# Patient Record
Sex: Male | Born: 1985 | Race: Black or African American | Hispanic: No | Marital: Single | Smoking: Never smoker
Health system: Southern US, Community
[De-identification: ages and names within clinical notes are randomized; demographics above are authoritative.]

## PROBLEM LIST (undated history)

## (undated) DIAGNOSIS — B20 Human immunodeficiency virus [HIV] disease: Secondary | ICD-10-CM

## (undated) DIAGNOSIS — K4090 Unilateral inguinal hernia, without obstruction or gangrene, not specified as recurrent: Secondary | ICD-10-CM

## (undated) DIAGNOSIS — S99919A Unspecified injury of unspecified ankle, initial encounter: Secondary | ICD-10-CM

## (undated) HISTORY — PX: INGUINAL HERNIA REPAIR: SUR1180

## (undated) HISTORY — DX: Human immunodeficiency virus (HIV) disease: B20

---

## 2011-11-23 ENCOUNTER — Encounter (HOSPITAL_COMMUNITY): Payer: Self-pay | Admitting: *Deleted

## 2011-11-23 ENCOUNTER — Emergency Department (HOSPITAL_COMMUNITY)
Admission: EM | Admit: 2011-11-23 | Discharge: 2011-11-23 | Disposition: A | Discharge status: Home / Self Care | Payer: No Typology Code available for payment source | Attending: Emergency Medicine | Admitting: Emergency Medicine

## 2011-11-23 DIAGNOSIS — M62830 Muscle spasm of back: Secondary | ICD-10-CM

## 2011-11-23 DIAGNOSIS — Z043 Encounter for examination and observation following other accident: Secondary | ICD-10-CM | POA: Insufficient documentation

## 2011-11-23 DIAGNOSIS — M5380 Other specified dorsopathies, site unspecified: Secondary | ICD-10-CM | POA: Insufficient documentation

## 2011-11-23 DIAGNOSIS — Z041 Encounter for examination and observation following transport accident: Secondary | ICD-10-CM

## 2011-11-23 HISTORY — DX: Unilateral inguinal hernia, without obstruction or gangrene, not specified as recurrent: K40.90

## 2011-11-23 MED ORDER — MELOXICAM 15 MG PO TABS
15.0000 mg | ORAL_TABLET | Freq: Every day | ORAL | Status: DC
Start: 2011-11-23 — End: 2012-01-11

## 2011-11-23 NOTE — ED Notes (Signed)
Patient given discharge instructions, information, prescriptions, and diet order. Patient states that they adequately understand discharge information given and to return to ED if symptoms return or worsen.     
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
## 2011-11-24 NOTE — ED Provider Notes (Signed)
Medical screening examination/treatment/procedure(s) were performed by non-physician practitioner and as supervising physician I was immediately available for consultation/collaboration.    Margarito Dehaas L Melanie Openshaw, MD 11/24/11 1702 
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
## 2012-01-11 ENCOUNTER — Emergency Department (HOSPITAL_COMMUNITY): Payer: BC Managed Care – PPO

## 2012-01-11 ENCOUNTER — Emergency Department (HOSPITAL_COMMUNITY)
Admission: EM | Admit: 2012-01-11 | Discharge: 2012-01-11 | Disposition: A | Discharge status: Home / Self Care | Payer: BC Managed Care – PPO | Attending: Emergency Medicine | Admitting: Emergency Medicine

## 2012-01-11 DIAGNOSIS — M7989 Other specified soft tissue disorders: Secondary | ICD-10-CM | POA: Insufficient documentation

## 2012-01-11 DIAGNOSIS — M76829 Posterior tibial tendinitis, unspecified leg: Secondary | ICD-10-CM | POA: Insufficient documentation

## 2012-01-11 DIAGNOSIS — Z8719 Personal history of other diseases of the digestive system: Secondary | ICD-10-CM | POA: Insufficient documentation

## 2012-01-11 MED ORDER — NAPROXEN 500 MG PO TABS: 500.0000 mg | ORAL_TABLET | Freq: Two times a day (BID) | ORAL | Status: DC

## 2012-01-11 NOTE — ED Provider Notes (Signed)
History     CSN: [id number]  Arrival date & time 01/11/12  0865   First MD Initiated Contact with Patient 01/11/12 1924      Chief Complaint  Patient presents with  . Ankle Pain    (Consider location/radiation/quality/duration/timing/severity/associated sxs/prior treatment) HPI Comments: Ronald Salas is a 26 y.o. male who presents with complaint of right foot/anile pain for about 3 days. States just started a new job and is standing a lot. Denies any injuries. States he has very flat feet and not wearing his orthotics. Denies numbness or weakness to the toes or distal foot. No redness, no fever, chills, malaise. No hx of the same.      Past Medical History  Diagnosis Date  . Inguinal hernia, unilateral     Past Surgical History  Procedure Date  . Inguinal hernia repair     No family history on file.  History  Substance Use Topics  . Smoking status: Not on file  . Smokeless tobacco: Not on file  . Alcohol Use:       Review of Systems  Constitutional: Negative for fever and chills.  Musculoskeletal: Positive for joint swelling and arthralgias.  Skin: Negative.   Neurological: Negative for weakness and numbness.    Allergies  Codeine  Home Medications  No current outpatient prescriptions on file.  BP 119/72  Pulse 73  Temp 98.4 F (36.9 C) (Oral)  Resp 17  SpO2 100%  Physical Exam  Nursing note and vitals reviewed. Constitutional: He is oriented to person, place, and time. He appears well-developed and well-nourished. No distress.  Eyes: Conjunctivae normal are normal.  Cardiovascular: Normal rate and normal heart sounds.   Pulmonary/Chest: Effort normal and breath sounds normal. No respiratory distress. He has no wheezes. He has no rales.  Musculoskeletal:       Swelling noted to the medial right ankle. Tender over posterior tibial tendon. Pain with active inversion of the ankle. No pain with passive inversion, pain with passive eversion. Normal  dorsal pedal pulses  Neurological: He is alert and oriented to person, place, and time.  Skin: Skin is warm and dry.    ED Course  Procedures (including critical care time)  Labs Reviewed - No data to display Dg Ankle Complete Right  01/11/2012  *RADIOLOGY REPORT*  Clinical Data: 26 year old male with right ankle pain and swelling. No known injury.  RIGHT ANKLE - COMPLETE 3+ VIEW  Comparison: None  Findings: Medial soft tissue swelling is noted.  No evidence of acute fracture, subluxation or dislocation identified.  No radio-opaque foreign bodies are present.  No focal bony lesions are noted.  The joint spaces are unremarkable.  IMPRESSION: Soft tissue swelling without bony or joint abnormality.   Original Report Authenticated By: Harmon Pier, M.D.      1. Posterior tibial tendonitis       MDM  Pt with non traumatic right ankle pain. Exam consistent with posterior tibial tendonitis. X-ray negative. Will do ACE wrap, ice, elevation, NSAIDs, follow up with orthopedics. Instructed to wear supportive shoes, and his orthotics.        Lottie Mussel, PA 01/12/12 [phone]

## 2012-01-11 NOTE — ED Notes (Signed)
Pt c/o pain and swelling to R ankle. Pt denies injury, but states he is on his feet at work a lot. Pt states R ankle is more swollen than L ankle. Pt ambulatory to exam room in triage with steady gait.

## 2012-01-12 NOTE — ED Provider Notes (Signed)
Medical screening examination/treatment/procedure(s) were performed by non-physician practitioner and as supervising physician I was immediately available for consultation/collaboration.  Doug Sou, MD 01/12/12 [phone]

## 2012-02-04 ENCOUNTER — Encounter (HOSPITAL_COMMUNITY): Payer: Self-pay | Admitting: Emergency Medicine

## 2012-02-04 ENCOUNTER — Emergency Department (HOSPITAL_COMMUNITY): Payer: BC Managed Care – PPO

## 2012-02-04 ENCOUNTER — Emergency Department (HOSPITAL_COMMUNITY)
Admission: EM | Admit: 2012-02-04 | Discharge: 2012-02-04 | Disposition: A | Discharge status: Home / Self Care | Payer: BC Managed Care – PPO | Attending: Emergency Medicine | Admitting: Emergency Medicine

## 2012-02-04 DIAGNOSIS — Z8719 Personal history of other diseases of the digestive system: Secondary | ICD-10-CM | POA: Insufficient documentation

## 2012-02-04 DIAGNOSIS — M25579 Pain in unspecified ankle and joints of unspecified foot: Secondary | ICD-10-CM | POA: Insufficient documentation

## 2012-02-04 MED ORDER — IBUPROFEN 600 MG PO TABS: 600.0000 mg | ORAL_TABLET | Freq: Four times a day (QID) | ORAL | Status: DC | PRN

## 2012-02-04 NOTE — ED Notes (Signed)
[id number]<AV> Expected date:<BR> Expected time:<BR> Means of arrival:<BR> Comments:<BR> Cleaning

## 2012-02-04 NOTE — ED Notes (Signed)
Patient states that he has pain to his ankle. Just woke up this am with the pain

## 2012-02-05 NOTE — ED Provider Notes (Signed)
Medical screening examination/treatment/procedure(s) were performed by non-physician practitioner and as supervising physician I was immediately available for consultation/collaboration.  Jeanae Whitmill, MD 02/05/12 1702 

## 2012-02-05 NOTE — ED Provider Notes (Signed)
History     CSN: [id number]  Arrival date & time 02/04/12  1534   First MD Initiated Contact with Patient 02/04/12 1654      Chief Complaint  Patient presents with  . Ankle Pain    (Consider location/radiation/quality/duration/timing/severity/associated sxs/prior treatment) HPI Comments: This is a 26 year old male, who presents emergency department with chief complaint of ankle pain. Patient seen here previously for the same. States that today he woke up in the pain was worse. The pain is located over the medial aspect of the right ankle. States that the pain is moderate to severe with palpation. He has tried naproxen with some relief. There are no aggravating or alleviating factors.  The history is provided by the patient. No language interpreter was used.    Past Medical History  Diagnosis Date  . Inguinal hernia, unilateral     Past Surgical History  Procedure Date  . Inguinal hernia repair     History reviewed. No pertinent family history.  History  Substance Use Topics  . Smoking status: Never Smoker   . Smokeless tobacco: Not on file  . Alcohol Use: No      Review of Systems  All other systems reviewed and are negative.    Allergies  Codeine  Home Medications   Current Outpatient Rx  Name  Route  Sig  Dispense  Refill  . NAPROXEN 500 MG PO TABS   Oral   Take 500 mg by mouth 2 (two) times daily as needed. For pain.         . IBUPROFEN 600 MG PO TABS   Oral   Take 1 tablet (600 mg total) by mouth every 6 (six) hours as needed for pain.   30 tablet   0     BP 113/68  Pulse 62  Temp 98.9 F (37.2 C) (Oral)  Resp 12  SpO2 100%  Physical Exam  Nursing note and vitals reviewed. Constitutional: He is oriented to person, place, and time. He appears well-developed and well-nourished.  HENT:  Head: Normocephalic and atraumatic.  Eyes: Conjunctivae normal and EOM are normal.  Neck: Normal range of motion.  Cardiovascular: Normal rate.     Pulmonary/Chest: Effort normal.  Abdominal: He exhibits no distension.  Musculoskeletal: Normal range of motion. He exhibits tenderness. He exhibits no edema.       Right ankle tender to palpation over the deltoid ligament, without swelling or heat. Range of motion and strength are intact.  Neurological: He is alert and oriented to person, place, and time.  Skin: Skin is dry.  Psychiatric: He has a normal mood and affect. His behavior is normal. Judgment and thought content normal.    ED Course  Procedures (including critical care time)  Labs Reviewed - No data to display Dg Ankle Complete Right  02/04/2012  *RADIOLOGY REPORT*  Clinical Data: Medial ankle pain  RIGHT ANKLE - COMPLETE 3+ VIEW  Comparison: 01/11/2012  Findings: Resolve soft tissue swelling.  Normal alignment without fracture.  Intact malleoli, talus and calcaneus.  IMPRESSION: No acute osseous finding.   Original Report Authenticated By: Judie Petit. Shick, M.D.      1. Ankle pain       MDM  26 year old male with ankle pain. I will race the patient's ankle, and recommend orthopedic followup. I believe this to be a deltoid tendinitis. Will treat the patient with ibuprofen per week, and encourage him to use ice. Patient understands and agrees with the plan. He is stable and ready  for discharge.       Roxy Horseman, PA-C 02/05/12 [phone]

## 2012-04-15 ENCOUNTER — Emergency Department (HOSPITAL_COMMUNITY)
Admission: EM | Admit: 2012-04-15 | Discharge: 2012-04-15 | Disposition: A | Discharge status: Home / Self Care | Payer: BC Managed Care – PPO | Attending: Emergency Medicine | Admitting: Emergency Medicine

## 2012-04-15 ENCOUNTER — Encounter (HOSPITAL_COMMUNITY): Payer: Self-pay | Admitting: *Deleted

## 2012-04-15 DIAGNOSIS — Z87828 Personal history of other (healed) physical injury and trauma: Secondary | ICD-10-CM | POA: Insufficient documentation

## 2012-04-15 DIAGNOSIS — R350 Frequency of micturition: Secondary | ICD-10-CM | POA: Insufficient documentation

## 2012-04-15 DIAGNOSIS — Z8719 Personal history of other diseases of the digestive system: Secondary | ICD-10-CM | POA: Insufficient documentation

## 2012-04-15 HISTORY — DX: Unspecified injury of unspecified ankle, initial encounter: S99.919A

## 2012-04-15 LAB — URINALYSIS, ROUTINE W REFLEX MICROSCOPIC
Bilirubin Urine: NEGATIVE
Glucose, UA: NEGATIVE mg/dL
Protein, ur: NEGATIVE mg/dL
Urobilinogen, UA: 1 mg/dL (ref 0.0–1.0)

## 2012-04-15 LAB — URINE MICROSCOPIC-ADD ON

## 2012-04-15 MED ORDER — PHENAZOPYRIDINE HCL 100 MG PO TABS: 100.0000 mg | ORAL_TABLET | Freq: Three times a day (TID) | ORAL | Status: DC

## 2012-04-15 MED ORDER — PHENAZOPYRIDINE HCL 100 MG PO TABS
100.0000 mg | ORAL_TABLET | Freq: Three times a day (TID) | ORAL | Status: DC
  Administered 2012-04-15: 100 mg via ORAL
  Filled 2012-04-15 (×5): qty 1

## 2012-04-15 NOTE — ED Provider Notes (Signed)
History     CSN: [id number]  Arrival date & time 04/15/12  0019   First MD Initiated Contact with Patient 04/15/12 0310      Chief Complaint  Patient presents with  . Urinary Frequency    (Consider location/radiation/quality/duration/timing/severity/associated sxs/prior treatment) HPI Comments: Patient report increased urinary frequency for the past 2 days without dysuria, penile discharge. No fever, diarrhea, abdominal pain testicular pain   Patient is a 27 y.o. male presenting with frequency. The history is provided by the patient.  Urinary Frequency This is a new problem. The current episode started yesterday. The problem occurs intermittently. The problem has been unchanged. Associated symptoms include urinary symptoms. Pertinent negatives include no chills or fever. He has tried nothing for the symptoms.    Past Medical History  Diagnosis Date  . Inguinal hernia, unilateral   . Ankle injury     Past Surgical History  Procedure Laterality Date  . Inguinal hernia repair      History reviewed. No pertinent family history.  History  Substance Use Topics  . Smoking status: Never Smoker   . Smokeless tobacco: Not on file  . Alcohol Use: No      Review of Systems  Constitutional: Negative for fever and chills.  Genitourinary: Positive for frequency. Negative for dysuria, urgency, hematuria, flank pain, discharge, penile swelling, scrotal swelling, genital sores, penile pain and testicular pain.  All other systems reviewed and are negative.    Allergies  Codeine  Home Medications   Current Outpatient Rx  Name  Route  Sig  Dispense  Refill  . Multiple Vitamin (MULTIVITAMIN WITH MINERALS) TABS   Oral   Take 1 tablet by mouth daily.         . phenazopyridine (PYRIDIUM) 100 MG tablet   Oral   Take 1 tablet (100 mg total) by mouth 3 (three) times daily with meals.   10 tablet   0     BP 107/72  Pulse 68  Temp(Src) 98.1 F (36.7 C) (Oral)  Resp 18   SpO2 100%  Physical Exam  Constitutional: He is oriented to person, place, and time. He appears well-developed and well-nourished.  HENT:  Head: Normocephalic.  Eyes: Pupils are equal, round, and reactive to light.  Neck: Normal range of motion.  Cardiovascular: Normal rate.   Pulmonary/Chest: Effort normal.  Abdominal: Soft.  Genitourinary: Penis normal. No penile tenderness.  Musculoskeletal: Normal range of motion.  Neurological: He is alert and oriented to person, place, and time.  Skin: Skin is warm and dry. No rash noted.    ED Course  Procedures (including critical care time)  Labs Reviewed  URINALYSIS, ROUTINE W REFLEX MICROSCOPIC - Abnormal; Notable for the following:    Hgb urine dipstick TRACE (*)    All other components within normal limits  URINE MICROSCOPIC-ADD ON   No results found.   1. Frequency of urination       MDM  Will referr to urology for FU         Arman Filter, NP 04/15/12 0400

## 2012-04-15 NOTE — ED Provider Notes (Signed)
Medical screening examination/treatment/procedure(s) were performed by non-physician practitioner and as supervising physician I was immediately available for consultation/collaboration.  Doug Sou, MD 04/15/12 [phone]

## 2012-04-15 NOTE — ED Provider Notes (Signed)
Medical screening examination/treatment/procedure(s) were performed by non-physician practitioner and as supervising physician I was immediately available for consultation/collaboration.  Doug Sou, MD 04/15/12 [phone]

## 2012-04-23 ENCOUNTER — Emergency Department (HOSPITAL_COMMUNITY)
Admission: EM | Admit: 2012-04-23 | Discharge: 2012-04-23 | Disposition: A | Discharge status: Home / Self Care | Payer: BC Managed Care – PPO | Attending: Emergency Medicine | Admitting: Emergency Medicine

## 2012-04-23 ENCOUNTER — Encounter (HOSPITAL_COMMUNITY): Payer: Self-pay | Admitting: *Deleted

## 2012-04-23 DIAGNOSIS — R350 Frequency of micturition: Secondary | ICD-10-CM | POA: Insufficient documentation

## 2012-04-23 DIAGNOSIS — Z0389 Encounter for observation for other suspected diseases and conditions ruled out: Secondary | ICD-10-CM | POA: Insufficient documentation

## 2012-04-23 DIAGNOSIS — Z79899 Other long term (current) drug therapy: Secondary | ICD-10-CM | POA: Insufficient documentation

## 2012-04-23 DIAGNOSIS — Z711 Person with feared health complaint in whom no diagnosis is made: Secondary | ICD-10-CM

## 2012-04-23 LAB — URINALYSIS, ROUTINE W REFLEX MICROSCOPIC
Bilirubin Urine: NEGATIVE
Nitrite: NEGATIVE
Specific Gravity, Urine: 1.029 (ref 1.005–1.030)
Urobilinogen, UA: 1 mg/dL (ref 0.0–1.0)
pH: 7.5 (ref 5.0–8.0)

## 2012-04-23 MED ORDER — LIDOCAINE HCL 1 % IJ SOLN
INTRAMUSCULAR | Status: AC
  Administered 2012-04-23: 1 mL via INTRAMUSCULAR
  Filled 2012-04-23: qty 20

## 2012-04-23 MED ORDER — CEFTRIAXONE SODIUM 250 MG IJ SOLR
250.0000 mg | Freq: Once | INTRAMUSCULAR | Status: AC
  Administered 2012-04-23: 250 mg via INTRAMUSCULAR
  Filled 2012-04-23: qty 250

## 2012-04-23 MED ORDER — AZITHROMYCIN 250 MG PO TABS
1000.0000 mg | ORAL_TABLET | Freq: Once | ORAL | Status: AC
  Administered 2012-04-23: 1000 mg via ORAL
  Filled 2012-04-23: qty 4

## 2012-04-23 NOTE — ED Provider Notes (Signed)
Medical screening examination/treatment/procedure(s) were performed by non-physician practitioner and as supervising physician I was immediately available for consultation/collaboration.    Semaja Lymon R Harriet Bollen, MD 04/23/12 2349 

## 2012-04-23 NOTE — ED Provider Notes (Signed)
History     CSN: [id number]  Arrival date & time 04/23/12  1308   First MD Initiated Contact with Patient 04/23/12 1940      Chief Complaint  Patient presents with  . Urinary Frequency    (Consider location/radiation/quality/duration/timing/severity/associated sxs/prior treatment) Patient is a 27 y.o. male presenting with frequency. The history is provided by the patient. No language interpreter was used.  Urinary Frequency This is a new problem. The current episode started today. The problem occurs rarely. The problem has been unchanged. Pertinent negatives include no abdominal pain, chills, fatigue, fever, nausea, rash or vomiting. Nothing aggravates the symptoms. He has tried nothing for the symptoms.  Pt c/o minimal penile discharged that started today.  Was checked last wk for urinary frequency which has since resolved.  Denies dysuria, fever, chills, n/v/d, abdominal pain.  No known exposure to STDs.  Past Medical History  Diagnosis Date  . Inguinal hernia, unilateral   . Ankle injury     Past Surgical History  Procedure Laterality Date  . Inguinal hernia repair      No family history on file.  History  Substance Use Topics  . Smoking status: Never Smoker   . Smokeless tobacco: Not on file  . Alcohol Use: No      Review of Systems  Constitutional: Negative for fever, chills and fatigue.  Gastrointestinal: Negative for nausea, vomiting and abdominal pain.  Genitourinary: Positive for frequency.  Skin: Negative for rash.    Allergies  Codeine  Home Medications   Current Outpatient Rx  Name  Route  Sig  Dispense  Refill  . meloxicam (MOBIC) 15 MG tablet   Oral   Take 15 mg by mouth daily.           BP 119/66  Pulse 65  Temp(Src) 98.4 F (36.9 C)  Resp 20  SpO2 100%  Physical Exam  Nursing note and vitals reviewed. Constitutional: He appears well-developed and well-nourished.  HENT:  Head: Normocephalic and atraumatic.  Eyes: Conjunctivae  are normal.  Neck: Normal range of motion.  Cardiovascular: Normal rate and regular rhythm.   Pulmonary/Chest: Effort normal and breath sounds normal.  Abdominal: Soft. He exhibits no distension. There is no tenderness.  Genitourinary: Penis normal. No penile tenderness.  Minimal clear discharge  Musculoskeletal: Normal range of motion.  Neurological: He is alert.  Skin: Skin is warm and dry. No rash noted. No erythema.    ED Course  Procedures (including critical care time)  Labs Reviewed  URINALYSIS, ROUTINE W REFLEX MICROSCOPIC - Abnormal; Notable for the following:    APPearance CLOUDY (*)    All other components within normal limits  GC/CHLAMYDIA PROBE AMP   No results found.   1. Concern about STD in male without diagnosis       MDM  Pt seen last week for urinary frequency which has since resolved. Concerned about discharge that started today.  Denies fever, chills, n/v/d, abdominal pain, dysuria.   Will collect UA and GC/clamydia.     UA: cloudy but neg for uti  Will tx empirically with 1g Azithromycin and 250mg  IM Rocephin   Vitals: unremarkable. Discharged in stable condition.    Discussed pt with attending during ED encounter.      Junius Finner, PA-C 04/23/12 2031

## 2012-04-23 NOTE — ED Notes (Signed)
Pt c/o penile discharge; checked last wk for urinary frequency--given rx and stopped taking after two days when the frequency stopped; discharge started today; denies dysuria; no known exposure to std

## 2012-04-24 LAB — GC/CHLAMYDIA PROBE AMP
CT Probe RNA: NEGATIVE
GC Probe RNA: NEGATIVE

## 2012-04-29 ENCOUNTER — Other Ambulatory Visit: Payer: Self-pay | Admitting: Family Medicine

## 2012-04-29 DIAGNOSIS — M25571 Pain in right ankle and joints of right foot: Secondary | ICD-10-CM

## 2012-05-03 ENCOUNTER — Other Ambulatory Visit: Payer: BC Managed Care – PPO

## 2012-05-06 ENCOUNTER — Ambulatory Visit
Admission: RE | Admit: 2012-05-06 | Discharge: 2012-05-06 | Disposition: A | Discharge status: Home / Self Care | Payer: BC Managed Care – PPO | Source: Ambulatory Visit | Attending: Family Medicine | Admitting: Family Medicine

## 2012-05-06 DIAGNOSIS — M25571 Pain in right ankle and joints of right foot: Secondary | ICD-10-CM

## 2012-07-04 ENCOUNTER — Encounter (HOSPITAL_COMMUNITY): Payer: Self-pay | Admitting: Nurse Practitioner

## 2012-07-04 ENCOUNTER — Emergency Department (HOSPITAL_COMMUNITY)
Admission: EM | Admit: 2012-07-04 | Discharge: 2012-07-04 | Disposition: A | Discharge status: Home / Self Care | Payer: BC Managed Care – PPO | Attending: Emergency Medicine | Admitting: Emergency Medicine

## 2012-07-04 DIAGNOSIS — Z8719 Personal history of other diseases of the digestive system: Secondary | ICD-10-CM | POA: Insufficient documentation

## 2012-07-04 DIAGNOSIS — M549 Dorsalgia, unspecified: Secondary | ICD-10-CM | POA: Insufficient documentation

## 2012-07-04 DIAGNOSIS — Z79899 Other long term (current) drug therapy: Secondary | ICD-10-CM | POA: Insufficient documentation

## 2012-07-04 DIAGNOSIS — N390 Urinary tract infection, site not specified: Secondary | ICD-10-CM | POA: Insufficient documentation

## 2012-07-04 DIAGNOSIS — R300 Dysuria: Secondary | ICD-10-CM | POA: Insufficient documentation

## 2012-07-04 LAB — URINALYSIS, ROUTINE W REFLEX MICROSCOPIC
Bilirubin Urine: NEGATIVE
Ketones, ur: NEGATIVE mg/dL
Nitrite: POSITIVE — AB
Protein, ur: NEGATIVE mg/dL
Urobilinogen, UA: 1 mg/dL (ref 0.0–1.0)

## 2012-07-04 LAB — URINE MICROSCOPIC-ADD ON

## 2012-07-04 MED ORDER — CEFTRIAXONE SODIUM 250 MG IJ SOLR
250.0000 mg | Freq: Once | INTRAMUSCULAR | Status: AC
  Administered 2012-07-04: 250 mg via INTRAMUSCULAR
  Filled 2012-07-04: qty 250

## 2012-07-04 MED ORDER — SULFAMETHOXAZOLE-TRIMETHOPRIM 800-160 MG PO TABS: 1.0000 | ORAL_TABLET | Freq: Two times a day (BID) | ORAL | Status: DC

## 2012-07-04 MED ORDER — DOXYCYCLINE HYCLATE 100 MG PO CAPS: 100.0000 mg | ORAL_CAPSULE | Freq: Two times a day (BID) | ORAL | Status: DC

## 2012-07-04 NOTE — ED Provider Notes (Signed)
History     CSN: [id number]  Arrival date & time 07/04/12  1047   First MD Initiated Contact with Patient 07/04/12 1125      Chief Complaint  Patient presents with  . Urinary Frequency    with whitish discharge    (Consider location/radiation/quality/duration/timing/severity/associated sxs/prior treatment) HPI Comments: Pt is a 27 y/o male who is otherwise healthy who presents with urinary frequency - this has been ongoing for several days - mild and not associated with dysuria, back pain, f/c/n/v.  He has no unprotected intercourse and denies d/c from his penis - he also reports being tested for STD and UA when he had similar sx several months ago.  I have reviewed the medical record and the testing was negative for GC ./ chlamydia and UA.  Denies any rashes on his penis or testicular pain and has no abd pain.  The history is provided by the patient and medical records.    Past Medical History  Diagnosis Date  . Inguinal hernia, unilateral   . Ankle injury     Past Surgical History  Procedure Laterality Date  . Inguinal hernia repair      No family history on file.  History  Substance Use Topics  . Smoking status: Never Smoker   . Smokeless tobacco: Not on file  . Alcohol Use: No      Review of Systems  Constitutional: Negative for fever.  Gastrointestinal: Negative for nausea and vomiting.  Genitourinary: Positive for frequency. Negative for urgency, hematuria, flank pain, discharge, penile swelling, scrotal swelling, genital sores, penile pain and testicular pain.  Skin: Negative for rash.    Allergies  Codeine  Home Medications   Current Outpatient Rx  Name  Route  Sig  Dispense  Refill  . meloxicam (MOBIC) 15 MG tablet   Oral   Take 15 mg by mouth daily.         Marland Kitchen sulfamethoxazole-trimethoprim (SEPTRA DS) 800-160 MG per tablet   Oral   Take 1 tablet by mouth every 12 (twelve) hours.   14 tablet   0     BP 119/78  Pulse 68  Temp(Src) 98.6  F (37 C) (Oral)  Resp 20  Ht 6' (1.829 m)  Wt 168 lb (76.204 kg)  BMI 22.78 kg/m2  SpO2 100%  Physical Exam  Nursing note and vitals reviewed. Constitutional: He appears well-developed and well-nourished. No distress.  HENT:  Head: Normocephalic and atraumatic.  Eyes: Conjunctivae are normal. Right eye exhibits no discharge. Left eye exhibits no discharge.  Cardiovascular: Normal rate and regular rhythm.   No murmur heard. Pulmonary/Chest: Effort normal and breath sounds normal. No respiratory distress. He has no wheezes. He has no rales. He exhibits no tenderness.  Abdominal: Soft. There is no tenderness (No SP ttp).  No CVA ttp  Musculoskeletal: He exhibits no edema.  Neurological: He is alert.  Normal gait, normal speech  Skin: Skin is warm and dry. No rash noted. No erythema.    ED Course  Procedures (including critical care time)  Labs Reviewed  URINALYSIS, ROUTINE W REFLEX MICROSCOPIC - Abnormal; Notable for the following:    Color, Urine ORANGE (*)    APPearance CLOUDY (*)    Hgb urine dipstick SMALL (*)    Nitrite POSITIVE (*)    Leukocytes, UA SMALL (*)    All other components within normal limits  URINE MICROSCOPIC-ADD ON - Abnormal; Notable for the following:    Bacteria, UA FEW (*)  All other components within normal limits  URINE CULTURE   No results found.   1. UTI (lower urinary tract infection)       MDM  Pt has UTI sx, but no hx of - UA pending - defer further STD testing to f/u health dept or PCP clinic.  Non toxic, well appaering, normal VS.   Urinalysis has borderline infection, we'll treat with Bactrim, culture, sent home.   Meds given in ED:  Medications - No data to display  New Prescriptions   SULFAMETHOXAZOLE-TRIMETHOPRIM (SEPTRA DS) 800-160 MG PER TABLET    Take 1 tablet by mouth every 12 (twelve) hours.        Vida Roller, MD 07/04/12 1240

## 2012-07-04 NOTE — ED Notes (Signed)
Per pt:  Urinary frequency started yesterday with yellowish discharge.

## 2012-07-04 NOTE — ED Notes (Addendum)
Pt placed up for discharge, however the MD wrote for a Rocephin injection of 250mg  and pt stated that the MD wanted to perform a swab.  MD decided to treat versus doing the swab.  Pt in hallway A- had to take pt back to TCU to give injection, then take back to ER to discharge.

## 2012-07-05 LAB — URINE CULTURE

## 2012-07-30 ENCOUNTER — Encounter (HOSPITAL_COMMUNITY): Payer: Self-pay | Admitting: Family Medicine

## 2012-07-30 ENCOUNTER — Emergency Department (HOSPITAL_COMMUNITY)
Admission: EM | Admit: 2012-07-30 | Discharge: 2012-07-30 | Disposition: A | Discharge status: Home / Self Care | Payer: BC Managed Care – PPO | Attending: Emergency Medicine | Admitting: Emergency Medicine

## 2012-07-30 DIAGNOSIS — R509 Fever, unspecified: Secondary | ICD-10-CM

## 2012-07-30 DIAGNOSIS — R51 Headache: Secondary | ICD-10-CM | POA: Insufficient documentation

## 2012-07-30 DIAGNOSIS — J029 Acute pharyngitis, unspecified: Secondary | ICD-10-CM

## 2012-07-30 DIAGNOSIS — Z8719 Personal history of other diseases of the digestive system: Secondary | ICD-10-CM | POA: Insufficient documentation

## 2012-07-30 DIAGNOSIS — R599 Enlarged lymph nodes, unspecified: Secondary | ICD-10-CM | POA: Insufficient documentation

## 2012-07-30 DIAGNOSIS — Z87828 Personal history of other (healed) physical injury and trauma: Secondary | ICD-10-CM | POA: Insufficient documentation

## 2012-07-30 DIAGNOSIS — R52 Pain, unspecified: Secondary | ICD-10-CM | POA: Insufficient documentation

## 2012-07-30 MED ORDER — ACETAMINOPHEN 325 MG PO TABS
650.0000 mg | ORAL_TABLET | Freq: Once | ORAL | Status: AC
  Administered 2012-07-30: 650 mg via ORAL
  Filled 2012-07-30: qty 2

## 2012-07-30 MED ORDER — KETOROLAC TROMETHAMINE 30 MG/ML IJ SOLN
30.0000 mg | Freq: Once | INTRAMUSCULAR | Status: DC
  Filled 2012-07-30: qty 1

## 2012-07-30 MED ORDER — SODIUM CHLORIDE 0.9 % IV BOLUS (SEPSIS)
1000.0000 mL | Freq: Once | INTRAVENOUS | Status: AC
  Administered 2012-07-30: 1000 mL via INTRAVENOUS

## 2012-07-30 MED ORDER — KETOROLAC TROMETHAMINE 30 MG/ML IJ SOLN
30.0000 mg | Freq: Once | INTRAMUSCULAR | Status: AC
  Administered 2012-07-30: 30 mg via INTRAVENOUS

## 2012-07-30 NOTE — ED Notes (Signed)
Patient states "I have the flu." C/o body aches, fever, sore throat and headache. Took Ibuprofen at 1500 for headache.

## 2012-07-30 NOTE — ED Notes (Signed)
Pt reports feeling better

## 2012-07-30 NOTE — ED Provider Notes (Signed)
   History    CSN: [id number] Arrival date & time 07/30/12  0019  First MD Initiated Contact with Patient 07/30/12 [phone]     Chief Complaint  Patient presents with  . Fever   (Consider location/radiation/quality/duration/timing/severity/associated sxs/prior Treatment) HPI Comments: He started feeling bad about 6 point he takes a ibuprofen at 3:00 in the afternoon, didn't help him feel better, so he presents to the emergency room 6 hours later with complaint of sore throat, generalized myalgias, and headache  Patient is a 27 y.o. male presenting with fever. The history is provided by the patient.  Fever Temp source:  Subjective Severity:  Moderate Onset quality:  Gradual Duration:  18 hours Timing:  Intermittent Chronicity:  New Relieved by:  Ibuprofen Associated symptoms: headaches, myalgias and sore throat   Associated symptoms: no chills, no congestion, no cough, no diarrhea, no dysuria and no rash    Past Medical History  Diagnosis Date  . Inguinal hernia, unilateral   . Ankle injury    Past Surgical History  Procedure Laterality Date  . Inguinal hernia repair     No family history on file. History  Substance Use Topics  . Smoking status: Never Smoker   . Smokeless tobacco: Not on file  . Alcohol Use: No    Review of Systems  Constitutional: Positive for fever. Negative for chills.  HENT: Positive for sore throat. Negative for congestion and trouble swallowing.   Respiratory: Negative for cough.   Gastrointestinal: Negative for diarrhea.  Genitourinary: Negative for dysuria.  Musculoskeletal: Positive for myalgias.  Skin: Negative for rash and wound.  Neurological: Positive for headaches. Negative for dizziness.  All other systems reviewed and are negative.    Allergies  Codeine  Home Medications   Current Outpatient Rx  Name  Route  Sig  Dispense  Refill  . meloxicam (MOBIC) 15 MG tablet   Oral   Take 15 mg by mouth daily.          BP 95/55   Pulse 90  Temp(Src) 99.4 F (37.4 C) (Oral)  Resp 18  Ht 6' (1.829 m)  Wt 180 lb (81.647 kg)  BMI 24.41 kg/m2  SpO2 99% Physical Exam  Constitutional: He appears well-developed and well-nourished.  HENT:  Head: Normocephalic.  Mouth/Throat: Uvula is midline. Edematous present. Posterior oropharyngeal erythema present. No tonsillar abscesses.  Eyes: Pupils are equal, round, and reactive to light.  Neck: Normal range of motion.  Cardiovascular: Normal rate and regular rhythm.   Pulmonary/Chest: Effort normal.  Abdominal: Soft. Bowel sounds are normal.  Musculoskeletal: Normal range of motion.  Lymphadenopathy:    He has cervical adenopathy.  Neurological: He is alert.  Skin: Skin is warm.    ED Course  Procedures (including critical care time) Labs Reviewed  RAPID STREP SCREEN   No results found. 1. Fever   2. Sore throat     MDM  Patient refused.  Rapid strep She states she feels, better after receiving 1 L IV fluid, and 30 mg of Toradol, IV  Arman Filter, NP 07/30/12 [phone]

## 2012-07-30 NOTE — ED Notes (Signed)
Pt declining to be tested for strep. Dondra Spry, NP made aware.

## 2012-07-30 NOTE — ED Provider Notes (Signed)
Medical screening examination/treatment/procedure(s) were performed by non-physician practitioner and as supervising physician I was immediately available for consultation/collaboration.  Sunnie Nielsen, MD 07/30/12 2306

## 2012-09-29 ENCOUNTER — Emergency Department (HOSPITAL_COMMUNITY)
Admission: EM | Admit: 2012-09-29 | Discharge: 2012-09-29 | Disposition: A | Discharge status: Home / Self Care | Payer: Self-pay | Attending: Emergency Medicine | Admitting: Emergency Medicine

## 2012-09-29 DIAGNOSIS — Z8719 Personal history of other diseases of the digestive system: Secondary | ICD-10-CM | POA: Insufficient documentation

## 2012-09-29 DIAGNOSIS — H5789 Other specified disorders of eye and adnexa: Secondary | ICD-10-CM | POA: Insufficient documentation

## 2012-09-29 DIAGNOSIS — Z87828 Personal history of other (healed) physical injury and trauma: Secondary | ICD-10-CM | POA: Insufficient documentation

## 2012-09-29 DIAGNOSIS — H109 Unspecified conjunctivitis: Secondary | ICD-10-CM | POA: Insufficient documentation

## 2012-09-29 DIAGNOSIS — H579 Unspecified disorder of eye and adnexa: Secondary | ICD-10-CM | POA: Insufficient documentation

## 2012-09-29 DIAGNOSIS — Z791 Long term (current) use of non-steroidal anti-inflammatories (NSAID): Secondary | ICD-10-CM | POA: Insufficient documentation

## 2012-09-29 MED ORDER — ERYTHROMYCIN 5 MG/GM OP OINT: TOPICAL_OINTMENT | Freq: Four times a day (QID) | OPHTHALMIC | Status: DC

## 2012-09-29 NOTE — ED Provider Notes (Signed)
CSN: [id number]     Arrival date & time 09/29/12  1841 History  This chart was scribed for Junious Silk, PA working with Dagmar Hait, MD by Quintella Reichert, ED Scribe. This patient was seen in room WTR7/WTR7 and the patient's care was started at 8:18 PM.     Chief Complaint  Patient presents with  . Conjunctivitis    The history is provided by the patient. No language interpreter was used.    HPI Comments: Ronald Salas is a 27 y.o. male with no chronic medical conditions who presents to the Emergency Department complaining of 3 days of constant, gradual-onset, gradually-worsening left eye irritation with associated redness, itchiness and discharge.  Pt also states that his eyes have been crusted shut in the mornings.  He notes that his coworker may have had pink-eye recently.  He has attempted to treat symptoms with Clear Eye and allergy eye drops, without relief.  He denies blurred vision, double vision, photophobia, pain on eye movements, cough, congestion, rhinorrhea, fever, vomiting or any other associated symptoms.    Past Medical History  Diagnosis Date  . Inguinal hernia, unilateral   . Ankle injury     Past Surgical History  Procedure Laterality Date  . Inguinal hernia repair      No family history on file.   History  Substance Use Topics  . Smoking status: Never Smoker   . Smokeless tobacco: Not on file  . Alcohol Use: No     Review of Systems  Constitutional: Negative for fever.  HENT: Negative for congestion and rhinorrhea.   Eyes: Positive for discharge, redness and itching. Negative for photophobia and visual disturbance.  Respiratory: Negative for cough.   Gastrointestinal: Negative for vomiting.  All other systems reviewed and are negative.      Allergies  Codeine  Home Medications   Current Outpatient Rx  Name  Route  Sig  Dispense  Refill  . acetaminophen (TYLENOL) 500 MG tablet   Oral   Take 500 mg by mouth every 6 (six)  hours as needed for pain.         . meloxicam (MOBIC) 15 MG tablet   Oral   Take 15 mg by mouth daily.          BP 124/76  Pulse 71  Temp(Src) 98.7 F (37.1 C) (Oral)  Resp 16  SpO2 99%  Physical Exam  Nursing note and vitals reviewed. Constitutional: He is oriented to person, place, and time. He appears well-developed and well-nourished. No distress.  HENT:  Head: Normocephalic and atraumatic.  Right Ear: External ear normal.  Left Ear: External ear normal.  Nose: Nose normal.  Eyes: EOM are normal. Pupils are equal, round, and reactive to light. Left conjunctiva is injected.  Injected left conjunctiva No pain with consensual light reflex No photophobia No pain with EOMs  Neck: Normal range of motion. No tracheal deviation present.  Cardiovascular: Normal rate, regular rhythm and normal heart sounds.   Pulmonary/Chest: Effort normal and breath sounds normal. No stridor.  Abdominal: Soft. He exhibits no distension. There is no tenderness.  Musculoskeletal: Normal range of motion.  Neurological: He is alert and oriented to person, place, and time.  Skin: Skin is warm and dry. He is not diaphoretic.  Psychiatric: He has a normal mood and affect. His behavior is normal.    ED Course  Procedures (including critical care time)   COORDINATION OF CARE: 8:23 PM-Discussed treatment plan which includes antibiotic eye drops with  pt at bedside and pt agreed to plan.    Labs Review Labs Reviewed - No data to display  Imaging Review No results found.  MDM   1. Conjunctivitis    Patient presentation consistent with conjunctivitis.  No purulent discharge, corneal abrasions, entrapment, consensual photophobia.  Presentation non-concerning for iritis. Will given erythromycin ointment for possible bacterial etiology.  Personal hygiene and frequent handwashing discussed.  Patient advised to followup with ophthalmologist if symptoms persist or worsen in any way including vision  change or purulent discharge.  Patient verbalizes understanding and is agreeable with discharge.     I personally performed the services described in this documentation, which was scribed in my presence. The recorded information has been reviewed and is accurate.     Mora Bellman, PA-C 09/29/12 2151  Mora Bellman, PA-C 09/29/12 2154

## 2012-09-29 NOTE — ED Notes (Signed)
Pt ambulatory to exam room with steady gait.  

## 2012-09-29 NOTE — ED Provider Notes (Signed)
Medical screening examination/treatment/procedure(s) were performed by non-physician practitioner and as supervising physician I was immediately available for consultation/collaboration.   William Ashby Leflore, MD 09/29/12 2333 

## 2012-09-29 NOTE — ED Notes (Signed)
Pt presents tot he ED from home via POV with a complaint of eye irritation.  Left eye appears red with slight swelling.  Pt describes co workers having conjunctivitis and the possibility of transmission to him.  Pt skin appears w/d and is in no apparent distress.

## 2012-12-19 ENCOUNTER — Emergency Department (HOSPITAL_COMMUNITY)
Admission: EM | Admit: 2012-12-19 | Discharge: 2012-12-19 | Disposition: A | Discharge status: Home / Self Care | Payer: BC Managed Care – PPO | Attending: Emergency Medicine | Admitting: Emergency Medicine

## 2012-12-19 ENCOUNTER — Emergency Department (HOSPITAL_COMMUNITY): Payer: Self-pay

## 2012-12-19 ENCOUNTER — Encounter (HOSPITAL_COMMUNITY): Payer: Self-pay | Admitting: Emergency Medicine

## 2012-12-19 DIAGNOSIS — Z8719 Personal history of other diseases of the digestive system: Secondary | ICD-10-CM | POA: Insufficient documentation

## 2012-12-19 DIAGNOSIS — W19XXXA Unspecified fall, initial encounter: Secondary | ICD-10-CM | POA: Insufficient documentation

## 2012-12-19 DIAGNOSIS — Z791 Long term (current) use of non-steroidal anti-inflammatories (NSAID): Secondary | ICD-10-CM | POA: Insufficient documentation

## 2012-12-19 DIAGNOSIS — X500XXA Overexertion from strenuous movement or load, initial encounter: Secondary | ICD-10-CM | POA: Insufficient documentation

## 2012-12-19 DIAGNOSIS — Y929 Unspecified place or not applicable: Secondary | ICD-10-CM | POA: Insufficient documentation

## 2012-12-19 DIAGNOSIS — R269 Unspecified abnormalities of gait and mobility: Secondary | ICD-10-CM | POA: Insufficient documentation

## 2012-12-19 DIAGNOSIS — IMO0002 Reserved for concepts with insufficient information to code with codable children: Secondary | ICD-10-CM | POA: Insufficient documentation

## 2012-12-19 DIAGNOSIS — Y9302 Activity, running: Secondary | ICD-10-CM | POA: Insufficient documentation

## 2012-12-19 DIAGNOSIS — S93409A Sprain of unspecified ligament of unspecified ankle, initial encounter: Secondary | ICD-10-CM | POA: Insufficient documentation

## 2012-12-19 MED ORDER — TRAMADOL HCL 50 MG PO TABS: 50.0000 mg | ORAL_TABLET | Freq: Four times a day (QID) | ORAL | Status: DC | PRN

## 2012-12-19 NOTE — ED Provider Notes (Signed)
CSN: [id number]     Arrival date & time 12/19/12  1301 History  This chart was scribed for Marlon Pel, PA-C, working with Audree Camel, MD, by [facility] ED Scribe. This patient was seen in room WTR7/WTR7 and the patient's care was started at 1:55 PM.   Chief Complaint  Patient presents with  . Ankle Pain    twisted ankle while running 2 weeks ago    The history is provided by the patient. No language interpreter was used.    HPI Comments: Ronald Salas is a 27 y.o. male who presents to the Emergency Department complaining of intermittent, moderate right foot pain with associated swelling onset 2 weeks ago when pt states that he twisted his ankle. He states that he fell at the time that he twisted his ankle, and he denies head injury or LOC. He also states that he has had mild pain in his right buttocks after the fall. He reports pain to the dorsal surface of hs right foot and in his right ankle. He states that his pain is worsened with walking. He states that he has applied ice to his foot, and has soaked his foot in how water with Epsom salt, with mild relief. He states that he has a history of tearing a tendon in his left ankle 1 year ago, but no prior injury to his right ankle. He denies any other pain or symptoms.   Past Medical History  Diagnosis Date  . Inguinal hernia, unilateral   . Ankle injury    Past Surgical History  Procedure Laterality Date  . Inguinal hernia repair     Family History  Problem Relation Age of Onset  . Hypertension Mother   . Hypertension Father    History  Substance Use Topics  . Smoking status: Never Smoker   . Smokeless tobacco: Not on file  . Alcohol Use: No    Review of Systems  Musculoskeletal: Positive for arthralgias (right ankle), gait problem (pain with walking) and joint swelling (right ankle). Negative for back pain and neck pain.  Neurological: Negative for syncope and headaches.  All other systems reviewed and are  negative.   Allergies  Codeine  Home Medications   Current Outpatient Rx  Name  Route  Sig  Dispense  Refill  . acetaminophen (TYLENOL) 500 MG tablet   Oral   Take 500 mg by mouth every 6 (six) hours as needed for pain.         . meloxicam (MOBIC) 15 MG tablet   Oral   Take 15 mg by mouth daily.         . traMADol (ULTRAM) 50 MG tablet   Oral   Take 1 tablet (50 mg total) by mouth every 6 (six) hours as needed.   20 tablet   0    Triage Vitals: BP 112/76  Pulse 86  Temp(Src) 99 F (37.2 C) (Oral)  Resp 18  Wt 168 lb (76.204 kg)  SpO2 97%  Physical Exam  Nursing note and vitals reviewed. Constitutional: He is oriented to person, place, and time. He appears well-developed and well-nourished. No distress.  HENT:  Head: Normocephalic and atraumatic.  Eyes: EOM are normal.  Neck: Neck supple. No tracheal deviation present.  Cardiovascular: Normal rate.   Pulmonary/Chest: Effort normal. No respiratory distress.  Musculoskeletal: He exhibits edema and tenderness.  Bilateral malleolar tissue swelling and tenderness. Some swelling and tenderness over the 3rd and 4th distal metatarsals. Achilles intact.  Neurological:  He is alert and oriented to person, place, and time.  Skin: Skin is warm and dry.  Psychiatric: He has a normal mood and affect. His behavior is normal.    ED Course  Procedures (including critical care time)  DIAGNOSTIC STUDIES: Oxygen Saturation is 97% on RA, normal by my interpretation.    COORDINATION OF CARE: 2:01 PM- Discussed radiology findings, and suspicion of a right ankle sprain. Pt advised of plan for treatment and pt agrees. ASO splint given by ER.   Labs Review Labs Reviewed - No data to display Imaging Review Dg Ankle Complete Right  12/19/2012   CLINICAL DATA:  Right ankle pain  EXAM: RIGHT ANKLE - COMPLETE 3+ VIEW  COMPARISON:  02/04/2012  FINDINGS: Generalized soft tissue swelling is noted. No acute fracture or dislocation is  seen.  IMPRESSION: Soft tissue swelling without acute bony abnormality.   Electronically Signed   By: Alcide Clever M.D.   On: 12/19/2012 13:49    EKG Interpretation   None       MDM0   1. 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 It has been determined that no acute conditions requiring further emergency intervention are present at this time. The patient/guardian have been advised of the diagnosis and plan. We have discussed signs and symptoms that warrant return to the ED, such as changes or worsening in symptoms.  Vital signs are stable at discharge. Filed Vitals:   12/19/12 1345  BP: 112/76  Pulse: 86  Temp: 99 F (37.2 C)  Resp: 18    Patient/guardian has voiced understanding and agreed to follow-up with the PCP or specialist.  I personally performed the services described in this documentation, which was scribed in my presence. The recorded information has been reviewed and is accurate.    Dorthula Matas, PA-C 12/19/12 1406

## 2012-12-19 NOTE — ED Notes (Signed)
Pt reports pain and ankle in r/foot after falling while running two weeks ago

## 2012-12-19 NOTE — ED Provider Notes (Signed)
Medical screening examination/treatment/procedure(s) were performed by non-physician practitioner and as supervising physician I was immediately available for consultation/collaboration.  EKG Interpretation   None         Audree Camel, MD 12/19/12 [phone]

## 2012-12-27 ENCOUNTER — Emergency Department (HOSPITAL_COMMUNITY): Payer: Self-pay

## 2012-12-27 ENCOUNTER — Encounter (HOSPITAL_COMMUNITY): Payer: Self-pay | Admitting: Emergency Medicine

## 2012-12-27 ENCOUNTER — Emergency Department (HOSPITAL_COMMUNITY)
Admission: EM | Admit: 2012-12-27 | Discharge: 2012-12-27 | Disposition: A | Discharge status: Home / Self Care | Payer: Self-pay | Attending: Emergency Medicine | Admitting: Emergency Medicine

## 2012-12-27 DIAGNOSIS — IMO0002 Reserved for concepts with insufficient information to code with codable children: Secondary | ICD-10-CM | POA: Insufficient documentation

## 2012-12-27 DIAGNOSIS — X58XXXA Exposure to other specified factors, initial encounter: Secondary | ICD-10-CM | POA: Insufficient documentation

## 2012-12-27 DIAGNOSIS — S93401A Sprain of unspecified ligament of right ankle, initial encounter: Secondary | ICD-10-CM

## 2012-12-27 DIAGNOSIS — Z8719 Personal history of other diseases of the digestive system: Secondary | ICD-10-CM | POA: Insufficient documentation

## 2012-12-27 DIAGNOSIS — G8911 Acute pain due to trauma: Secondary | ICD-10-CM | POA: Insufficient documentation

## 2012-12-27 DIAGNOSIS — Z87828 Personal history of other (healed) physical injury and trauma: Secondary | ICD-10-CM | POA: Insufficient documentation

## 2012-12-27 DIAGNOSIS — Y9389 Activity, other specified: Secondary | ICD-10-CM | POA: Insufficient documentation

## 2012-12-27 DIAGNOSIS — M25579 Pain in unspecified ankle and joints of unspecified foot: Secondary | ICD-10-CM | POA: Insufficient documentation

## 2012-12-27 DIAGNOSIS — S93601A Unspecified sprain of right foot, initial encounter: Secondary | ICD-10-CM

## 2012-12-27 DIAGNOSIS — S76011A Strain of muscle, fascia and tendon of right hip, initial encounter: Secondary | ICD-10-CM

## 2012-12-27 DIAGNOSIS — Z791 Long term (current) use of non-steroidal anti-inflammatories (NSAID): Secondary | ICD-10-CM | POA: Insufficient documentation

## 2012-12-27 DIAGNOSIS — Y929 Unspecified place or not applicable: Secondary | ICD-10-CM | POA: Insufficient documentation

## 2012-12-27 MED ORDER — IBUPROFEN 800 MG PO TABS: 800.0000 mg | ORAL_TABLET | Freq: Three times a day (TID) | ORAL | Status: DC

## 2012-12-27 NOTE — ED Notes (Signed)
Pt reports r/hip pain . Pain started 5 days after he applied cam walker an r/foot at home. Denies trauma to r/hip

## 2012-12-27 NOTE — ED Provider Notes (Signed)
CSN: [id number]     Arrival date & time 12/27/12  1121 History   First MD Initiated Contact with Patient 12/27/12 1135     Chief Complaint  Patient presents with  . Hip Pain    pt reports hip sore over last 2 days   (Consider location/radiation/quality/duration/timing/severity/associated sxs/prior Treatment) HPI Ronald Salas is a 27 y.o. male who presents emergency department complaining of pain in his right hip. Patient states that he injured his foot approximately 3 weeks ago. [place] States that he went to an urgent care where they did x-rays of his ankle and were told that it was not broken. They gave him an ASO brace which he is wearing. Patient states he also had a boot - CAM walker at home which he is now wearing on top of the brace because his ankle hurts when he walks. States boot gives him a little more support. States since wearing the boot he's developed a pain in his right hip. States pain is mainly in the right groin and is worse with movement of his right leg. Patient states he's taking tramadol and ibuprofen with no relief. States his ankle is improving but his foot continues to hurt and continues to be swollen.  Past Medical History  Diagnosis Date  . Inguinal hernia, unilateral   . Ankle injury    Past Surgical History  Procedure Laterality Date  . Inguinal hernia repair     Family History  Problem Relation Age of Onset  . Hypertension Mother   . Hypertension Father    History  Substance Use Topics  . Smoking status: Never Smoker   . Smokeless tobacco: Not on file  . Alcohol Use: No    Review of Systems  Constitutional: Negative for fever and chills.  Respiratory: Negative for cough, chest tightness and shortness of breath.   Cardiovascular: Negative for chest pain, palpitations and leg swelling.  Genitourinary: Negative for dysuria, urgency, frequency and hematuria.  Musculoskeletal: Positive for arthralgias and  joint swelling.  Skin: Negative for rash.  Allergic/Immunologic: Negative for immunocompromised state.  Neurological: Negative for weakness, light-headedness, numbness and headaches.    Allergies  Codeine  Home Medications   Current Outpatient Rx  Name  Route  Sig  Dispense  Refill  . acetaminophen (TYLENOL) 500 MG tablet   Oral   Take 500 mg by mouth every 6 (six) hours as needed for pain.         . meloxicam (MOBIC) 15 MG tablet   Oral   Take 15 mg by mouth daily.         . traMADol (ULTRAM) 50 MG tablet   Oral   Take 1 tablet (50 mg total) by mouth every 6 (six) hours as needed.   20 tablet   0    BP 123/67  Pulse 94  Temp(Src) 98.3 F (36.8 C) (Oral)  Resp 18  Wt 170 lb (77.111 kg)  SpO2 96% Physical Exam  Nursing note and vitals reviewed. Constitutional: He appears well-developed and well-nourished. No distress.  HENT:  Head: Normocephalic and atraumatic.  Eyes: Conjunctivae are normal.  Neck: Neck supple.  Cardiovascular: Normal rate, regular rhythm and normal heart sounds.   Pulmonary/Chest: Effort normal. No respiratory distress. He has no wheezes. He has no rales.  Musculoskeletal:  Normal appearing right ankle. There is mild swelling to the right dorsal foot. Tender to palpation over the distal third and fourth metatarsals. Pain  with toe range of motion of toes 3 and 4. Ankle is normal with no tenderness over lateral or medial malleolus. Patient is wearing an ASO brace in a cam walker. Tender to palpation over the right anterior hip, over the flexors. Patient is unable to actively flex right hip due to pain.   Neurological: He is alert.  Skin: Skin is warm and dry.    ED Course  Procedures (including critical care time) Labs Review Labs Reviewed - No data to display Imaging Review No results found.  EKG Interpretation   None       MDM   1. Strain of hip flexor, right, initial encounter   2. Ankle sprain, right, initial encounter   3.  Foot sprain, right, initial encounter     The patient continues to have ankle pain and foot pain after injuring it over 3 weeks ago now. There is no swelling of the ankle on the exam. There is mild swelling of the foot but I suspect it is from where the ASO brace was. I did x-ray it since foot was not x-rayed an initial visit and is negative. I did recommend him to stop wearing a Cam Walker this far out and followup with orthopedics Dr. if his pain continues. I suspect his hip pain is due to a strain in his hip flexor muscles. I think this is due to him wearing the Cam Walker and limping with walking. I have instructed him to start exercises and stretches. Heat and ice. Ibuprofen for pain. Followup with orthopedics  Filed Vitals:   12/27/12 1131 12/27/12 1249  BP: 123/67 105/62  Pulse: 94   Temp: 98.3 F (36.8 C)   TempSrc: Oral   Resp: 18   Weight: 170 lb (77.111 kg)   SpO2: 96%       Lottie Mussel, PA-C 12/27/12 1951

## 2012-12-27 NOTE — ED Provider Notes (Signed)
Medical screening examination/treatment/procedure(s) were performed by non-physician practitioner and as supervising physician I was immediately available for consultation/collaboration.  EKG Interpretation   None        Shon Baton, MD 12/27/12 2031

## 2013-01-27 ENCOUNTER — Encounter (HOSPITAL_COMMUNITY): Payer: Self-pay | Admitting: Emergency Medicine

## 2013-01-27 ENCOUNTER — Emergency Department (HOSPITAL_COMMUNITY)
Admission: EM | Admit: 2013-01-27 | Discharge: 2013-01-27 | Discharge status: Against Medical Advice | Payer: BC Managed Care – PPO | Attending: Emergency Medicine | Admitting: Emergency Medicine

## 2013-01-27 DIAGNOSIS — G8911 Acute pain due to trauma: Secondary | ICD-10-CM | POA: Insufficient documentation

## 2013-01-27 DIAGNOSIS — R52 Pain, unspecified: Secondary | ICD-10-CM | POA: Insufficient documentation

## 2013-01-27 DIAGNOSIS — M25579 Pain in unspecified ankle and joints of unspecified foot: Secondary | ICD-10-CM | POA: Insufficient documentation

## 2013-01-27 NOTE — ED Notes (Signed)
Pt not in [facility] when called

## 2013-01-27 NOTE — ED Notes (Signed)
PT not in [facility] when called.

## 2013-01-27 NOTE — ED Notes (Signed)
Pt c/o gen body aches x 1 month.  States that he also sprained his rt ankle in November and it doesn't feel any better.  States that his other ankle now also hurts.  Denies NVD.  Denies cough.

## 2013-03-09 ENCOUNTER — Emergency Department (HOSPITAL_COMMUNITY)
Admission: EM | Admit: 2013-03-09 | Discharge: 2013-03-10 | Disposition: A | Discharge status: Home / Self Care | Payer: BC Managed Care – PPO | Attending: Emergency Medicine | Admitting: Emergency Medicine

## 2013-03-09 ENCOUNTER — Encounter (HOSPITAL_COMMUNITY): Payer: Self-pay | Admitting: Emergency Medicine

## 2013-03-09 DIAGNOSIS — Z87828 Personal history of other (healed) physical injury and trauma: Secondary | ICD-10-CM | POA: Insufficient documentation

## 2013-03-09 DIAGNOSIS — R369 Urethral discharge, unspecified: Secondary | ICD-10-CM | POA: Insufficient documentation

## 2013-03-09 DIAGNOSIS — R350 Frequency of micturition: Secondary | ICD-10-CM

## 2013-03-09 DIAGNOSIS — Z79899 Other long term (current) drug therapy: Secondary | ICD-10-CM | POA: Insufficient documentation

## 2013-03-09 DIAGNOSIS — Z8719 Personal history of other diseases of the digestive system: Secondary | ICD-10-CM | POA: Insufficient documentation

## 2013-03-09 DIAGNOSIS — N390 Urinary tract infection, site not specified: Secondary | ICD-10-CM

## 2013-03-09 LAB — URINALYSIS, ROUTINE W REFLEX MICROSCOPIC
BILIRUBIN URINE: NEGATIVE
Glucose, UA: NEGATIVE mg/dL
Ketones, ur: NEGATIVE mg/dL
Leukocytes, UA: NEGATIVE
Nitrite: NEGATIVE
Protein, ur: NEGATIVE mg/dL
SPECIFIC GRAVITY, URINE: 1.035 — AB (ref 1.005–1.030)
UROBILINOGEN UA: 1 mg/dL (ref 0.0–1.0)
pH: 6 (ref 5.0–8.0)

## 2013-03-09 LAB — URINE MICROSCOPIC-ADD ON

## 2013-03-09 NOTE — ED Provider Notes (Signed)
CSN: [id number]     Arrival date & time 03/09/13  2307 History   First MD Initiated Contact with Patient 03/09/13 2332     Chief Complaint  Patient presents with  . Urinary Frequency   (Consider location/radiation/quality/duration/timing/severity/associated sxs/prior Treatment) HPI Comments: Patient reports new dysuria and penile discharge that started yesterday.  No other health problems.  He has had a UTI in the past.  Never seen by urology.  History of oral, but no anal intercourse.    Patient is a 28 y.o. male presenting with frequency. The history is provided by the patient. No language interpreter was used.  Urinary Frequency This is a new problem. The current episode started yesterday. The problem occurs constantly. The problem has been unchanged. Pertinent negatives include no abdominal pain, fever, nausea or vomiting. Nothing aggravates the symptoms. Treatments tried: AZO. The treatment provided no relief.    Past Medical History  Diagnosis Date  . Inguinal hernia, unilateral   . Ankle injury    Past Surgical History  Procedure Laterality Date  . Inguinal hernia repair     Family History  Problem Relation Age of Onset  . Hypertension Mother   . Hypertension Father    History  Substance Use Topics  . Smoking status: Never Smoker   . Smokeless tobacco: Not on file  . Alcohol Use: No    Review of Systems  Constitutional: Negative for fever.  Gastrointestinal: Negative for nausea, vomiting and abdominal pain.  Genitourinary: Positive for frequency.    Allergies  Codeine  Home Medications   Current Outpatient Rx  Name  Route  Sig  Dispense  Refill  . ibuprofen (ADVIL,MOTRIN) 800 MG tablet   Oral   Take 1 tablet (800 mg total) by mouth 3 (three) times daily.   21 tablet   0   . meloxicam (MOBIC) 15 MG tablet   Oral   Take 15 mg by mouth daily.          BP 127/77  Pulse 82  Temp(Src) 98.6 F (37 C) (Oral)  Resp 20  Ht 5\' 11"  (1.803 m)  Wt 167 lb  (75.751 kg)  BMI 23.30 kg/m2  SpO2 98% Physical Exam  Nursing note and vitals reviewed. Constitutional: He is oriented to person, place, and time. He appears well-developed and well-nourished.  HENT:  Head: Normocephalic and atraumatic.  Eyes: Conjunctivae and EOM are normal.  Neck: Normal range of motion.  Cardiovascular: Normal rate.   Pulmonary/Chest: Effort normal.  Abdominal: He exhibits no distension.  Genitourinary: Penis normal.  Normal circumcised penis with mild clear penile discharge, no lesions, masses, or other abnormality.  Musculoskeletal: Normal range of motion.  Neurological: He is alert and oriented to person, place, and time.  Skin: Skin is dry.  Psychiatric: He has a normal mood and affect. His behavior is normal. Judgment and thought content normal.    ED Course  Procedures (including critical care time) Results for orders placed during the hospital encounter of 03/09/13  URINALYSIS, ROUTINE W REFLEX MICROSCOPIC      Result Value Range   Color, Urine YELLOW  YELLOW   APPearance CLEAR  CLEAR   Specific Gravity, Urine 1.035 (*) 1.005 - 1.030   pH 6.0  5.0 - 8.0   Glucose, UA NEGATIVE  NEGATIVE mg/dL   Hgb urine dipstick MODERATE (*) NEGATIVE   Bilirubin Urine NEGATIVE  NEGATIVE   Ketones, ur NEGATIVE  NEGATIVE mg/dL   Protein, ur NEGATIVE  NEGATIVE mg/dL  Urobilinogen, UA 1.0  0.0 - 1.0 mg/dL   Nitrite NEGATIVE  NEGATIVE   Leukocytes, UA NEGATIVE  NEGATIVE  URINE MICROSCOPIC-ADD ON      Result Value Range   Squamous Epithelial / LPF RARE  RARE   WBC, UA 3-6  <3 WBC/hpf   RBC / HPF 3-6  <3 RBC/hpf   Bacteria, UA RARE  RARE   No results found.    EKG Interpretation   None       MDM   1. Urinary frequency   2. UTI (lower urinary tract infection)     Patient with urinary frequency.  Discussed with Dr. Read Drivers.  Will treat with macrobid.  Will send urine for culture.  Urology follow up.  GC pending.  Patient is stable and ready for discharge.   He declines CBG, but there is no glucose in the urine.    Roxy Horseman, PA-C 03/10/13 [phone]

## 2013-03-09 NOTE — ED Notes (Signed)
Pt reports and increase of urination and clear discharge from genital area since Saturday. Pt denies any pain on urination or has he noticed any bleeding during urination, no fever or back pain. Pt states that he has had a hx of UTI and symptoms feel the same. Pt states that he took Azo yesterday.

## 2013-03-10 LAB — GC/CHLAMYDIA PROBE AMP
CT Probe RNA: POSITIVE — AB
GC PROBE AMP APTIMA: NEGATIVE

## 2013-03-10 MED ORDER — NITROFURANTOIN MONOHYD MACRO 100 MG PO CAPS: 100.0000 mg | ORAL_CAPSULE | Freq: Two times a day (BID) | ORAL | Status: DC

## 2013-03-10 NOTE — ED Provider Notes (Signed)
Medical screening examination/treatment/procedure(s) were performed by non-physician practitioner and as supervising physician I was immediately available for consultation/collaboration.  EKG Interpretation   None         Hanley SeamenJohn L Treysean Petruzzi, MD 03/10/13 [phone]

## 2013-03-10 NOTE — ED Notes (Signed)
Pt refuse CBG testing .Ronald Salas.RN notified

## 2013-03-10 NOTE — Discharge Instructions (Signed)
Urinary Tract Infection  Urinary tract infections (UTIs) can develop anywhere along your urinary tract. Your urinary tract is your body's drainage system for removing wastes and extra water. Your urinary tract includes two kidneys, two ureters, a bladder, and a urethra. Your kidneys are a pair of bean-shaped organs. Each kidney is about the size of your fist. They are located below your ribs, one on each side of your spine.  CAUSES  Infections are caused by microbes, which are microscopic organisms, including fungi, viruses, and bacteria. These organisms are so small that they can only be seen through a microscope. Bacteria are the microbes that most commonly cause UTIs.  SYMPTOMS   Symptoms of UTIs may vary by age and gender of the patient and by the location of the infection. Symptoms in young women typically include a frequent and intense urge to urinate and a painful, burning feeling in the bladder or urethra during urination. Older women and men are more likely to be tired, shaky, and weak and have muscle aches and abdominal pain. A fever may mean the infection is in your kidneys. Other symptoms of a kidney infection include pain in your back or sides below the ribs, nausea, and vomiting.  DIAGNOSIS  To diagnose a UTI, your caregiver will ask you about your symptoms. Your caregiver also will ask to provide a urine sample. The urine sample will be tested for bacteria and white blood cells. White blood cells are made by your body to help fight infection.  TREATMENT   Typically, UTIs can be treated with medication. Because most UTIs are caused by a bacterial infection, they usually can be treated with the use of antibiotics. The choice of antibiotic and length of treatment depend on your symptoms and the type of bacteria causing your infection.  HOME CARE INSTRUCTIONS   If you were prescribed antibiotics, take them exactly as your caregiver instructs you. Finish the medication even if you feel better after you  have only taken some of the medication.   Drink enough water and fluids to keep your urine clear or pale yellow.   Avoid caffeine, tea, and carbonated beverages. They tend to irritate your bladder.   Empty your bladder often. Avoid holding urine for long periods of time.   Empty your bladder before and after sexual intercourse.   After a bowel movement, women should cleanse from front to back. Use each tissue only once.  SEEK MEDICAL CARE IF:    You have back pain.   You develop a fever.   Your symptoms do not begin to resolve within 3 days.  SEEK IMMEDIATE MEDICAL CARE IF:    You have severe back pain or lower abdominal pain.   You develop chills.   You have nausea or vomiting.   You have continued burning or discomfort with urination.  MAKE SURE YOU:    Understand these instructions.   Will watch your condition.   Will get help right away if you are not doing well or get worse.  Document Released: 11/01/2004 Document Revised: 07/24/2011 Document Reviewed: 03/02/2011  Exit[employer] Patient Information 2014 [employer].

## 2013-03-11 LAB — URINE CULTURE
Colony Count: NO GROWTH
Culture: NO GROWTH

## 2013-03-11 NOTE — ED Notes (Signed)
Chart sent to EDP office for review.+ Chlamydia 

## 2013-03-14 ENCOUNTER — Telehealth (HOSPITAL_COMMUNITY): Payer: Self-pay | Admitting: Emergency Medicine

## 2013-03-14 NOTE — ED Notes (Signed)
Chart returned from EDP. Per Dahlia ClientHannah Muthersbaugh PA-C, Azithromycin 1 gram PO x 1. No refills.

## 2013-05-27 ENCOUNTER — Emergency Department (HOSPITAL_COMMUNITY)
Admission: EM | Admit: 2013-05-27 | Discharge: 2013-05-27 | Disposition: A | Discharge status: Home / Self Care | Payer: BC Managed Care – PPO | Attending: Emergency Medicine | Admitting: Emergency Medicine

## 2013-05-27 ENCOUNTER — Encounter (HOSPITAL_COMMUNITY): Payer: Self-pay | Admitting: Emergency Medicine

## 2013-05-27 DIAGNOSIS — Z87828 Personal history of other (healed) physical injury and trauma: Secondary | ICD-10-CM | POA: Insufficient documentation

## 2013-05-27 DIAGNOSIS — Z791 Long term (current) use of non-steroidal anti-inflammatories (NSAID): Secondary | ICD-10-CM | POA: Insufficient documentation

## 2013-05-27 DIAGNOSIS — Z79899 Other long term (current) drug therapy: Secondary | ICD-10-CM | POA: Insufficient documentation

## 2013-05-27 DIAGNOSIS — B081 Molluscum contagiosum: Secondary | ICD-10-CM

## 2013-05-27 DIAGNOSIS — Z8719 Personal history of other diseases of the digestive system: Secondary | ICD-10-CM | POA: Insufficient documentation

## 2013-05-27 LAB — RPR

## 2013-05-27 NOTE — ED Provider Notes (Signed)
Medical screening examination/treatment/procedure(s) were performed by non-physician practitioner and as supervising physician I was immediately available for consultation/collaboration.   EKG Interpretation None        Kristen N Ward, DO 05/27/13 1241 

## 2013-05-27 NOTE — ED Provider Notes (Signed)
CSN: [id number]     Arrival date & time 05/27/13  1206 History  This chart was scribed for non-physician practitioner, Teressa LowerVrinda Johannah Rozas, NP, working with Layla MawKristen N Ward, DO by Charline BillsEssence Howell, ED Scribe. This patient was seen in room WTR9/WTR9 and the patient's care was started at 12:16 PM.   Chief Complaint  Patient presents with  . Rash    The history is provided by the patient. No language interpreter was used.   HPI Comments: Ronald Salas is a 28 y.o. male who presents to the Emergency Department complaining of allergic reaction on penis shaft onset yesterday. Pt reports recently using a new body spray and suspects an allergic reaction. Pt denies associated pain. He has tried cortisone cream with no relief. Pt state that he has not been sexually active in the recent history  Past Medical History  Diagnosis Date  . Inguinal hernia, unilateral   . Ankle injury    Past Surgical History  Procedure Laterality Date  . Inguinal hernia repair     Family History  Problem Relation Age of Onset  . Hypertension Mother   . Hypertension Father    History  Substance Use Topics  . Smoking status: Never Smoker   . Smokeless tobacco: Not on file  . Alcohol Use: No    Review of Systems  Skin: Positive for rash.  All other systems reviewed and are negative.   Allergies  Codeine  Home Medications   Prior to Admission medications   Medication Sig Start Date End Date Taking? Authorizing Provider  ibuprofen (ADVIL,MOTRIN) 800 MG tablet Take 1 tablet (800 mg total) by mouth 3 (three) times daily. 12/27/12   Tatyana A Kirichenko, PA-C  meloxicam (MOBIC) 15 MG tablet Take 15 mg by mouth daily.    Historical Provider, MD  nitrofurantoin, macrocrystal-monohydrate, (MACROBID) 100 MG capsule Take 1 capsule (100 mg total) by mouth 2 (two) times daily. 03/10/13   Roxy Horsemanobert Browning, PA-C   There were no vitals taken for this visit. Physical Exam  Nursing note and vitals reviewed. Constitutional:  He is oriented to person, place, and time. He appears well-developed and well-nourished.  HENT:  Head: Normocephalic and atraumatic.  Eyes: EOM are normal.  Neck: Neck supple.  Cardiovascular: Normal rate.   Pulmonary/Chest: Effort normal.  Genitourinary:  Pt has multiple raised group of papules to the shaft of the penis  Musculoskeletal: Normal range of motion.  Neurological: He is alert and oriented to person, place, and time.  Skin: Skin is warm and dry.  Psychiatric: He has a normal mood and affect. His behavior is normal.    ED Course  Procedures (including critical care time) DIAGNOSTIC STUDIES: Oxygen Saturation is 99% on RA, normal by my interpretation.    COORDINATION OF CARE: 12:20 PM-Discussed treatment plan with pt at bedside and pt agreed to plan.   Labs Review Labs Reviewed - No data to display  Imaging Review No results found.   EKG Interpretation None      MDM   Final diagnoses:  Molluscum contagiosum    Rash consistent with molluscum. Will have follow up at the health department;rpr drawn   I personally performed the services described in this documentation, which was scribed in my presence. The recorded information has been reviewed and is accurate.    Teressa LowerVrinda Jaimin Krupka, NP 05/27/13 1240

## 2013-05-27 NOTE — ED Notes (Signed)
Pt states had a rash come on penis yesterday, states it's small bumps, denies pain.

## 2013-05-27 NOTE — Discharge Instructions (Signed)
Molluscum Contagiosum  Molluscum contagiosum is a viral infection of the skin that causes smooth surfaced, firm, small (3 to 5 mm), dome-shaped bumps (papules) which are flesh-colored. The bumps usually do not hurt or itch. In children, they most often appear on the face, trunk, arms and legs. In adults, the growths are commonly found on the genitals, thighs, face, neck, and belly (abdomen). The infection may be spread to others by close (skin to skin) contact (such as occurs in schools and swimming pools), sharing towels and clothing, and through sexual contact. The bumps usually disappear without treatment in 2 to 4 months, especially in children. You may have them treated to avoid spreading them. Scraping (curetting) the middle part (central plug) of the bump with a needle or sharp curette, or application of liquid nitrogen for 8 or 9 seconds usually cures the infection.  HOME CARE INSTRUCTIONS   · Do not scratch the bumps. This may spread the infection to other parts of the body and to other people.  · Avoid close contact with others, including sexual contact, until the bumps disappear. Do not share towels or clothing.  · If liquid nitrogen was used, blisters will form. Leave the blisters alone and cover with a bandage. The tops will fall off by themselves in 7 to 14 days.  · Four months without a lesion is usually a cure.  SEEK IMMEDIATE MEDICAL CARE IF:  · You have a fever.  · You develop swelling, redness, pain, tenderness, or warmth in the areas of the bumps. They may be infected.  Document Released: 01/20/2000 Document Revised: 04/16/2011 Document Reviewed: 07/02/2008  Exit[employer]® Patient Information ©2014 [employer].

## 2013-06-03 ENCOUNTER — Telehealth (HOSPITAL_BASED_OUTPATIENT_CLINIC_OR_DEPARTMENT_OTHER): Payer: Self-pay

## 2013-06-03 NOTE — Telephone Encounter (Signed)
Pt calling for STD results.  ID verified x 2.  Pt informed RPR non reactive.

## 2013-07-16 ENCOUNTER — Telehealth (HOSPITAL_BASED_OUTPATIENT_CLINIC_OR_DEPARTMENT_OTHER): Payer: Self-pay | Admitting: Emergency Medicine

## 2013-08-07 ENCOUNTER — Encounter (HOSPITAL_COMMUNITY): Payer: Self-pay | Admitting: Emergency Medicine

## 2013-08-07 ENCOUNTER — Emergency Department (HOSPITAL_COMMUNITY)
Admission: EM | Admit: 2013-08-07 | Discharge: 2013-08-07 | Disposition: A | Discharge status: Home / Self Care | Payer: BC Managed Care – PPO | Attending: Emergency Medicine | Admitting: Emergency Medicine

## 2013-08-07 DIAGNOSIS — R195 Other fecal abnormalities: Secondary | ICD-10-CM

## 2013-08-07 DIAGNOSIS — R197 Diarrhea, unspecified: Secondary | ICD-10-CM | POA: Insufficient documentation

## 2013-08-07 DIAGNOSIS — Z79899 Other long term (current) drug therapy: Secondary | ICD-10-CM | POA: Insufficient documentation

## 2013-08-07 DIAGNOSIS — Z8719 Personal history of other diseases of the digestive system: Secondary | ICD-10-CM | POA: Insufficient documentation

## 2013-08-07 DIAGNOSIS — R112 Nausea with vomiting, unspecified: Secondary | ICD-10-CM | POA: Insufficient documentation

## 2013-08-07 DIAGNOSIS — R110 Nausea: Secondary | ICD-10-CM

## 2013-08-07 DIAGNOSIS — R198 Other specified symptoms and signs involving the digestive system and abdomen: Secondary | ICD-10-CM | POA: Insufficient documentation

## 2013-08-07 DIAGNOSIS — Z791 Long term (current) use of non-steroidal anti-inflammatories (NSAID): Secondary | ICD-10-CM | POA: Insufficient documentation

## 2013-08-07 MED ORDER — ONDANSETRON 8 MG PO TBDP
8.0000 mg | ORAL_TABLET | Freq: Once | ORAL | Status: AC
  Administered 2013-08-07: 8 mg via ORAL
  Filled 2013-08-07: qty 1

## 2013-08-07 MED ORDER — ONDANSETRON 4 MG PO TBDP: ORAL_TABLET | ORAL | Status: DC

## 2013-08-07 NOTE — ED Provider Notes (Signed)
CSN: [id number]     Arrival date & time 08/07/13  2059 History   First MD Initiated Contact with Patient 08/07/13 2128     Chief Complaint  Patient presents with  . Nausea     (Consider location/radiation/quality/duration/timing/severity/associated sxs/prior Treatment) The history is provided by the patient and medical records. No language interpreter was used.    Ronald Salas is a 28 y.o. male  with a hx of repaired inguinal hernia presents to the Emergency Department complaining of gradual, persistent, progressively worsening nausea onset this am upon waking.  Pt reports he had 2 episodes of loose stool, no watery stool, no melena or hematachezia.  Pt reports he ate pizza at 8pm causing nausea without vomiting.  Pt reports he feels as if he could have another bowel movement, but nausea is improving.  Pt reports he ate at a new [employer] restaurant last night, but was also around a friend who has N/V/D yesterday.  Associated symptoms include subjective fever.  Nothing makes it better and eating pizza makes it worse.  Pt denies chills, headache, neck pain, chest pain, SOB, abd pain, vomiting, weakness, dizziness, syncope, dysuria.      Past Medical History  Diagnosis Date  . Inguinal hernia, unilateral   . Ankle injury    Past Surgical History  Procedure Laterality Date  . Inguinal hernia repair     Family History  Problem Relation Age of Onset  . Hypertension Mother   . Hypertension Father    History  Substance Use Topics  . Smoking status: Never Smoker   . Smokeless tobacco: Not on file  . Alcohol Use: No    Review of Systems  Constitutional: Negative for fever, diaphoresis, appetite change, fatigue and unexpected weight change.  HENT: Negative for mouth sores and trouble swallowing.   Respiratory: Negative for cough, chest tightness, shortness of breath, wheezing and stridor.   Cardiovascular: Negative for chest pain and palpitations.  Gastrointestinal: Positive for nausea  and diarrhea. Negative for vomiting, abdominal pain, constipation, blood in stool, abdominal distention and rectal pain.  Genitourinary: Negative for dysuria, urgency, frequency, hematuria, flank pain and difficulty urinating.  Musculoskeletal: Negative for back pain, neck pain and neck stiffness.  Skin: Negative for rash.  Neurological: Negative for weakness.  Hematological: Negative for adenopathy.  Psychiatric/Behavioral: Negative for confusion.  All other systems reviewed and are negative.     Allergies  Codeine  Home Medications   Prior to Admission medications   Medication Sig Start Date End Date Taking? Authorizing Provider  naproxen (NAPROSYN) 500 MG tablet Take 500 mg by mouth as needed for moderate pain.   Yes Historical Provider, MD  ondansetron (ZOFRAN ODT) 4 MG disintegrating tablet 4mg  ODT q4 hours prn nausea/vomit 08/07/13   Xaviar Lunn, PA-C   BP 128/70  Pulse 50  Temp(Src) 98.5 F (36.9 C) (Oral)  Resp 15  Ht 5\' 11"  (1.803 m)  Wt 180 lb (81.647 kg)  BMI 25.12 kg/m2  SpO2 100% Physical Exam  Nursing note and vitals reviewed. Constitutional: He appears well-developed and well-nourished. No distress.  Awake, alert, nontoxic appearance  HENT:  Head: Normocephalic and atraumatic.  Mouth/Throat: Oropharynx is clear and moist. No oropharyngeal exudate.  Eyes: Conjunctivae are normal. No scleral icterus.  Neck: Normal range of motion. Neck supple.  Cardiovascular: Normal rate, regular rhythm, normal heart sounds and intact distal pulses.   No murmur heard. Pulmonary/Chest: Effort normal and breath sounds normal. No respiratory distress. He has no wheezes.  Clear and  equal breath sounds  Abdominal: Soft. Bowel sounds are normal. He exhibits no distension and no mass. There is no tenderness. There is no rebound and no guarding.  Abdomen soft and nontender No CVA tenderness Normal bowel sounds  Musculoskeletal: Normal range of motion. He exhibits no edema.   Neurological: He is alert.  Speech is clear and goal oriented Moves extremities without ataxia  Skin: Skin is warm and dry. He is not diaphoretic.  Psychiatric: He has a normal mood and affect.    ED Course  Procedures (including critical care time) Labs Review Labs Reviewed - No data to display  Imaging Review No results found.   EKG Interpretation None      MDM   Final diagnoses:  Nausea  Passage of loose stools   Ronald Salas presents with nausea and several bouts of loose stools after eating [place] food with a friend who had episodes of nausea vomiting and diarrhea.  Patient denies abdominal pain, emesis.  He reports he otherwise feels fine however greasy foods makes her stomach feel queasy again.  Patient given Zofran here in the emergency department with significant improvement in symptoms.  BRAT diet discussed and patient given prescription for Zofran.  Abdomen soft and nontender on repeat exam.  Is tolerating by mouth fluids and crackers without difficulty or return of nausea.  No indication of appendicitis, bowel obstruction, bowel perforation, cholecystitis, diverticulitis.  Patient discharged home with symptomatic treatment and given strict instructions for follow-up with their primary care physician.  I have also discussed reasons to return immediately to the ER.  Patient expresses understanding and agrees with plan.  I have personally reviewed patient's vitals, nursing note and any pertinent labs or imaging.  I performed an undressed physical exam.    At this time, it has been determined that no acute conditions requiring further emergency intervention. The patient/guardian have been advised of the diagnosis and plan. I reviewed all labs and imaging including any potential incidental findings. We have discussed signs and symptoms that warrant return to the ED, such as intractable vomiting, high fevers.  Patient/guardian has voiced understanding and agreed to  follow-up with the PCP or specialist in 3 days.  Vital signs are stable at discharge.   BP 128/70  Pulse 50  Temp(Src) 98.5 F (36.9 C) (Oral)  Resp 15  Ht 5\' 11"  (1.803 m)  Wt 180 lb (81.647 kg)  BMI 25.12 kg/m2  SpO2 100%             Dierdre ForthHannah Ula Couvillon, PA-C 08/07/13 2340

## 2013-08-07 NOTE — ED Notes (Addendum)
Pt states he woke up this morning with nausea that has been consistent all day. Diarrhea x 3 today. Pt states he feels as if he had a fever earlier today. Denies pain.

## 2013-08-07 NOTE — Discharge Instructions (Signed)
1. Medications: zofran, usual home medications 2. Treatment: rest, drink plenty of fluids, advance diet slowly 3. Follow Up: Please followup with your primary doctor for discussion of your diagnoses and further evaluation after today's visit; if you do not have a primary care doctor use the resource guide provided to find one;    Food Choices to Help Relieve Diarrhea When you have diarrhea, the foods you eat and your eating habits are very important. Choosing the right foods and drinks can help relieve diarrhea. Also, because diarrhea can last up to 7 days, you need to replace lost fluids and electrolytes (such as sodium, potassium, and chloride) in order to help prevent dehydration.  WHAT GENERAL GUIDELINES DO I NEED TO FOLLOW?  Slowly drink 1 cup (8 oz) of fluid for each episode of diarrhea. If you are getting enough fluid, your urine will be clear or pale yellow.  Eat starchy foods. Some good choices include white rice, white toast, pasta, low-fiber cereal, baked potatoes (without the skin), saltine crackers, and bagels.  Avoid large servings of any cooked vegetables.  Limit fruit to two servings per day. A serving is  cup or 1 small piece.  Choose foods with less than 2 g of fiber per serving.  Limit fats to less than 8 tsp (38 g) per day.  Avoid fried foods.  Eat foods that have probiotics in them. Probiotics can be found in certain dairy products.  Avoid foods and beverages that may increase the speed at which food moves through the stomach and intestines (gastrointestinal tract). Things to avoid include:  High-fiber foods, such as dried fruit, raw fruits and vegetables, nuts, seeds, and whole grain foods.  Spicy foods and high-fat foods.  Foods and beverages sweetened with high-fructose corn syrup, honey, or sugar alcohols such as xylitol, sorbitol, and mannitol. WHAT FOODS ARE RECOMMENDED? Grains White rice. White, [place], or pita breads (fresh or toasted), including plain  rolls, buns, or bagels. White pasta. Saltine, soda, or graham crackers. Pretzels. Low-fiber cereal. Cooked cereals made with water (such as cornmeal, farina, or cream cereals). Plain muffins. Matzo. Melba toast. Zwieback.  Vegetables Potatoes (without the skin). Strained tomato and vegetable juices. Most well-cooked and canned vegetables without seeds. Tender lettuce. Fruits Cooked or canned applesauce, apricots, cherries, fruit cocktail, grapefruit, peaches, pears, or plums. Fresh bananas, apples without skin, cherries, grapes, cantaloupe, grapefruit, peaches, oranges, or plums.  Meat and Other Protein Products Baked or boiled chicken. Eggs. Tofu. Fish. Seafood. Smooth peanut butter. Ground or well-cooked tender beef, ham, veal, lamb, pork, or poultry.  Dairy Plain yogurt, kefir, and unsweetened liquid yogurt. Lactose-free milk, buttermilk, or soy milk. Plain hard cheese. Beverages Sport drinks. Clear broths. Diluted fruit juices (except prune). Regular, caffeine-free sodas such as ginger ale. Water. Decaffeinated teas. Oral rehydration solutions. Sugar-free beverages not sweetened with sugar alcohols. Other Bouillon, broth, or soups made from recommended foods.  The items listed above may not be a complete list of recommended foods or beverages. Contact your dietitian for more options. WHAT FOODS ARE NOT RECOMMENDED? Grains Whole grain, whole wheat, bran, or rye breads, rolls, pastas, crackers, and cereals. Wild or brown rice. Cereals that contain more than 2 g of fiber per serving. Corn tortillas or taco shells. Cooked or dry oatmeal. Granola. Popcorn. Vegetables Raw vegetables. Cabbage, broccoli, Brussels sprouts, artichokes, baked beans, beet greens, corn, kale, legumes, peas, sweet potatoes, and yams. Potato skins. Cooked spinach and cabbage. Fruits Dried fruit, including raisins and dates. Raw fruits. Stewed or dried  prunes. Fresh apples with skin, apricots, mangoes, pears, raspberries,  and strawberries.  Meat and Other Protein Products Chunky peanut butter. Nuts and seeds. Beans and lentils. Tomasa Blase.  Dairy High-fat cheeses. Milk, chocolate milk, and beverages made with milk, such as milk shakes. Cream. Ice cream. Sweets and Desserts Sweet rolls, doughnuts, and sweet breads. Pancakes and waffles. Fats and Oils Butter. Cream sauces. Margarine. Salad oils. Plain salad dressings. Olives. Avocados.  Beverages Caffeinated beverages (such as coffee, tea, soda, or energy drinks). Alcoholic beverages. Fruit juices with pulp. Prune juice. Soft drinks sweetened with high-fructose corn syrup or sugar alcohols. Other Coconut. Hot sauce. Chili powder. Mayonnaise. Gravy. Cream-based or milk-based soups.  The items listed above may not be a complete list of foods and beverages to avoid. Contact your dietitian for more information. WHAT SHOULD I DO IF I BECOME DEHYDRATED? Diarrhea can sometimes lead to dehydration. Signs of dehydration include dark urine and dry mouth and skin. If you think you are dehydrated, you should rehydrate with an oral rehydration solution. These solutions can be purchased at pharmacies, retail stores, or online.  Drink -1 cup (120-240 mL) of oral rehydration solution each time you have an episode of diarrhea. If drinking this amount makes your diarrhea worse, try drinking smaller amounts more often. For example, drink 1-3 tsp (5-15 mL) every 5-10 minutes.  A general rule for staying hydrated is to drink 1-2 L of fluid per day. Talk to your health care provider about the specific amount you should be drinking each day. Drink enough fluids to keep your urine clear or pale yellow. Document Released: 04/14/2003 Document Revised: 01/27/2013 Document Reviewed: 12/15/2012 [facility] Patient Information 2015 [place], [place]. This information is not intended to replace advice given to you by your health care provider. Make sure you discuss any questions you have with your health  care provider.    Nausea and Vomiting Nausea is a sick feeling that often comes before throwing up (vomiting). Vomiting is a reflex where stomach contents come out of your mouth. Vomiting can cause severe loss of body fluids (dehydration). Children and elderly adults can become dehydrated quickly, especially if they also have diarrhea. Nausea and vomiting are symptoms of a condition or disease. It is important to find the cause of your symptoms. CAUSES   Direct irritation of the stomach lining. This irritation can result from increased acid production (gastroesophageal reflux disease), infection, food poisoning, taking certain medicines (such as nonsteroidal anti-inflammatory drugs), alcohol use, or tobacco use.  Signals from the brain.These signals could be caused by a headache, heat exposure, an inner ear disturbance, increased pressure in the brain from injury, infection, a tumor, or a concussion, pain, emotional stimulus, or metabolic problems.  An obstruction in the gastrointestinal tract (bowel obstruction).  Illnesses such as diabetes, hepatitis, gallbladder problems, appendicitis, kidney problems, cancer, sepsis, atypical symptoms of a heart attack, or eating disorders.  Medical treatments such as chemotherapy and radiation.  Receiving medicine that makes you sleep (general anesthetic) during surgery. DIAGNOSIS Your caregiver may ask for tests to be done if the problems do not improve after a few days. Tests may also be done if symptoms are severe or if the reason for the nausea and vomiting is not clear. Tests may include:  Urine tests.  Blood tests.  Stool tests.  Cultures (to look for evidence of infection).  X-rays or other imaging studies. Test results can help your caregiver make decisions about treatment or the need for additional tests. TREATMENT You need  to stay well hydrated. Drink frequently but in small amounts.You may wish to drink water, sports drinks, clear  broth, or eat frozen ice pops or gelatin dessert to help stay hydrated.When you eat, eating slowly may help prevent nausea.There are also some antinausea medicines that may help prevent nausea. HOME CARE INSTRUCTIONS   Take all medicine as directed by your caregiver.  If you do not have an appetite, do not force yourself to eat. However, you must continue to drink fluids.  If you have an appetite, eat a normal diet unless your caregiver tells you differently.  Eat a variety of complex carbohydrates (rice, wheat, potatoes, bread), lean meats, yogurt, fruits, and vegetables.  Avoid high-fat foods because they are more difficult to digest.  Drink enough water and fluids to keep your urine clear or pale yellow.  If you are dehydrated, ask your caregiver for specific rehydration instructions. Signs of dehydration may include:  Severe thirst.  Dry lips and mouth.  Dizziness.  Dark urine.  Decreasing urine frequency and amount.  Confusion.  Rapid breathing or pulse. SEEK IMMEDIATE MEDICAL CARE IF:   You have blood or brown flecks (like coffee grounds) in your vomit.  You have black or bloody stools.  You have a severe headache or stiff neck.  You are confused.  You have severe abdominal pain.  You have chest pain or trouble breathing.  You do not urinate at least once every 8 hours.  You develop cold or clammy skin.  You continue to vomit for longer than 24 to 48 hours.  You have a fever. MAKE SURE YOU:   Understand these instructions.  Will watch your condition.  Will get help right away if you are not doing well or get worse. Document Released: 01/22/2005 Document Revised: 04/16/2011 Document Reviewed: 06/21/2010 [facility] Patient Information 2015 [place], [place]. This information is not intended to replace advice given to you by your health care provider. Make sure you discuss any questions you have with your health care provider.    Emergency Department  Resource Guide 1) Find a Doctor and Pay Out of Pocket Although you won't have to find out who is covered by your insurance plan, it is a good idea to ask around and get recommendations. You will then need to call the office and see if the doctor you have chosen will accept you as a new patient and what types of options they offer for patients who are self-pay. Some doctors offer discounts or will set up payment plans for their patients who do not have insurance, but you will need to ask so you aren't surprised when you get to your appointment.  2) Contact Your Local Health Department Not all health departments have doctors that can see patients for sick visits, but many do, so it is worth a call to see if yours does. If you don't know where your local health department is, you can check in your phone book. The CDC also has a tool to help you locate your state's health department, and many state websites also have listings of all of their local health departments.  3) Find a Walk-in Clinic If your illness is not likely to be very severe or complicated, you may want to try a walk in clinic. These are popping up all over the country in pharmacies, drugstores, and shopping centers. They're usually staffed by nurse practitioners or physician assistants that have been trained to treat common illnesses and complaints. They're usually fairly quick and  inexpensive. However, if you have serious medical issues or chronic medical problems, these are probably not your best option.  No Primary Care Doctor: - Call Health Connect at  [phone] - they can help you locate a primary care doctor that  accepts your insurance, provides certain services, etc. - Physician Referral Service- [phone]  Chronic Pain Problems: Organization         Address  Phone   Notes  [facility]  [phone] Patients need to be referred by their primary care doctor.   Medication Assistance: Organization          Address  Phone   Notes  [facility] Medication [facility] [address] [place] [place], [place] [address] [phone] --Must be a resident of [facility] -- Must have NO insurance coverage whatsoever (no Medicaid/ Medicare, etc.) -- The pt. MUST have a primary care doctor that directs their care regularly and follows them in the community   [facility]ist  [phone]   [employer]  [phone]    Agencies that provide inexpensive medical care: Organization         Address  Phone   Notes  [facility]  [phone]   [facility]    [phone]   [facility] [address] [place], [place] [address] [phone]   [facility] [address] [place] [phone]   Planned Parenthood    [phone]   [facility]    [phone]   [facility]  [address], [place] Phone:  [phone], Fax:  [phone] Hours of Operation:  9 am - 6 pm, M-F.  Also accepts Medicaid/Medicare and self-pay.  [facility]  [address], [place] Phone: [phone], Fax: [phone]. Hours of Operation:  8:30 am - 5:30 pm, M-F.  Also accepts Medicaid and self-pay.  [facility] [place] [address] [place] Phone: [phone]   [facility] [address] Natasha Bence [place], [place] [phone], Ext. [phone] Mondays & Thursdays: 7-9 AM.  First 15 patients are seen on a first come, first serve basis.    Medicaid-accepting [facility] Providers:  Organization         Address  Phone   Notes  [facility] [address], [place] [phone] Also accepts self-pay patients.  [facility] [address] Laurell Josephs [place], [place]  [phone]   [facility] [address], [place] [phone]   [facility] [address] [place] [phone]   Renaye Rakers [address], [place]   [phone] Only accepts [place] Access [place] patients after they have their name applied to their card.   Self-Pay (no insurance) in [facility]:  Organization         Address  Phone   Notes  Sickle Cell Patients, [facility] [address] [place], [place] [phone]   [facility] [address] [place], [place] [phone]   [facility] [place]  [address], [place] [phone]   [facility]/Dr. Osei-Bonsu  [address], [place] or [address], [place] [phone] Phone number for both  [place] and [place] locations is the same.  [facility] [address], [place] [phone]   [facility] [address] [place] [address], [place] or [address] [phone]   [facility] [address], [place] [phone], phone; [phone], fax Sees patients 1st and 3rd Saturday of every month.  Must not qualify for public or private insurance (i.e. Medicaid, Medicare, [place] Health Choice, Veterans' Benefits)  Household income should be no more than 200% of the poverty level The clinic cannot treat you if you are pregnant or think you are pregnant  Sexually transmitted diseases are not treated at the clinic.    Dental Care: Organization         Address  Phone  Notes  [facility] [address], [place] [phone] Accepts children up to age 26 who are enrolled in [place] or [place] Health Choice; pregnant women with a Medicaid card; and children who have applied for Medicaid or [place] Health Choice, but were declined, whose parents can pay a reduced fee at time of service.  [facility]  [place]  [address], [place] [phone] Accepts children up to age 26 who are enrolled in [place] or [place] Health Choice; pregnant women with a Medicaid card; and children who have applied for Medicaid or [place] Health Choice, but were declined, whose parents can pay a reduced fee at time of service.  [facility]  [address], [place] [phone] Patients are seen by appointment only. Walk-ins are not accepted. [facility] will see patients 26 years of age and older. Monday - Tuesday (8am-5pm) Most Wednesdays (8:30-5pm) $30 per visit, cash only  [facility]  [address], [facility] [place] [phone] Patients are seen by appointment only. Walk-ins are not accepted. [facility] will see patients 26 years of age and older. One Wednesday Evening (Monthly: Volunteer Based).  $30 per visit, cash only  [facility]  [phone]) [phone] for adults; Children under age 594, call Graduate Pediatric Dentistry at [phone]. Children aged 314-14, please call [phone]) [phone] to request a pediatric application.  Dental services are provided in all areas of dental care including fillings, crowns and bridges, complete and partial dentures, implants, gum treatment, root canals, and extractions. Preventive care is also provided. Treatment is provided to both adults and children. Patients are selected via a lottery and there is often a waiting list.   [facility] [address], [place]  [phone]) [phone] [email or web address]   [facility] [address], [place], [place] [phone], Ext. [phone] Second and Fourth Thursday of each month, opens at 6:30 AM; Clinic ends at 9 AM.  Patients are seen on a first-come first-served basis, and a limited number are seen during each clinic.   [facility]  [address], [place], [place] [phone]   Eligibility Requirements You must  have lived in [place], [place], or [place] counties for at least the last three months.   You cannot be eligible for state or federal sponsored [place] Cityhealthcare insurance, including CIGNA[place]eterans Administration, [place]icaid, or [employer]Medicare.   You generally cannot be eligible for healthcare insurance through your employer.    How to apply: Eligibility screenings are held every Tuesday and Wednesday afternoon from 1:00 pm until 4:00 pm.  You do not need an appointment for the interview!  [facility] [address] [place], [place] [phone]   [facility]  [phone]   [facility] Health Department  [phone]   [facility]  [phone]    Behavioral Health Resources in the Community: Intensive Outpatient Programs Organization         Address  Phone  Notes  [facility] [address] [place], [place] [phone]   [facility] [address], [place], [place] [phone]   [facility] [address], [place], [place]  [phone]   [facility] Mental Health [address]  [place], [place] [phone] or [phone]   Substance Abuse Resources Organization         Address  Phone  Notes  [facility] Drug Services  [phone]   [facility]  [phone]   [facility] [place]  [phone]   Floydene Flock  [phone]   Residential & Outpatient Substance Abuse Program  [phone]   Psychological Services Organization         Address  Phone  Notes  [facility]  [phone]   [facility] Services  [phone]   [facility] [address], [place] [phone] or [phone]    Mobile Crisis Teams Organization         Address  Phone  Notes  [facility], Mobile Crisis Care Unit  [phone]   [facility]  [address] [place], [place] [phone]   Doristine Locks [address] [place] [phone]    Self-Help/Support Groups Organization         Address  Phone             Notes  [facility] [place] - variety of support groups  [phone]- I7437963 Call for more information  Narcotics Anonymous (NA), [facility] [address], [employer] [place]  2 meetings at this location   Statistician         Address  Phone  Notes  [facility] [address],    [place]  [phone]   [facility]  [address] [place] [address], [place], [place] [phone]   [facility] [address] [place], [place] [phone] Admissions: 8am-3pm M-F  [facility] [address],    [place], [place] [phone]   [facility] [address] [place], [place], [place] [phone]   [facility] [address]  [place], [place] [phone]   [facility] - Intensive Outpatient [address] Laurell Josephs [address], [place], [place] [phone]   [facility] ([facility] [address]  [place], [place] [phone] or [phone]   [facility] ([facility]) [address], [place], [place] [phone] Accepts Medicaid  [facility] [place] [address]  [place] [phone] Substance Abuse/Addiction Treatment   [facility] Organization         Address  Phone  Notes  [facility]  [phone]   Angie Fava, PhD [address] Ervin Knack [place], [place]   [phone] or [phone]   [facility] Behavioral   [address] [place], [place] [phone]   [facility] [address] [place], [place] [phone] Insurance/Medicaid/sponsorship through [employer] and Families [address]  [place], [place] [phone] [place], [place] [phone]    Dr. Adele Schilder  [phone]   [facility] Dept. 1) [address], [place] 2) [place] 3)  [address], [place] [phone]  [phone]   [place] [phone] or [phone] (After Hours)

## 2013-08-08 NOTE — ED Provider Notes (Signed)
Medical screening examination/treatment/procedure(s) were performed by non-physician practitioner and as supervising physician I was immediately available for consultation/collaboration.   Megan E Docherty, MD 08/08/13 0000 

## 2013-09-30 ENCOUNTER — Emergency Department (HOSPITAL_COMMUNITY)
Admission: EM | Admit: 2013-09-30 | Discharge: 2013-09-30 | Disposition: A | Discharge status: Home / Self Care | Payer: BC Managed Care – PPO | Attending: Emergency Medicine | Admitting: Emergency Medicine

## 2013-09-30 ENCOUNTER — Encounter (HOSPITAL_COMMUNITY): Payer: Self-pay | Admitting: Emergency Medicine

## 2013-09-30 DIAGNOSIS — Z87828 Personal history of other (healed) physical injury and trauma: Secondary | ICD-10-CM | POA: Insufficient documentation

## 2013-09-30 DIAGNOSIS — R3589 Other polyuria: Secondary | ICD-10-CM | POA: Insufficient documentation

## 2013-09-30 DIAGNOSIS — R358 Other polyuria: Secondary | ICD-10-CM | POA: Insufficient documentation

## 2013-09-30 DIAGNOSIS — N342 Other urethritis: Secondary | ICD-10-CM | POA: Insufficient documentation

## 2013-09-30 DIAGNOSIS — Z8719 Personal history of other diseases of the digestive system: Secondary | ICD-10-CM | POA: Insufficient documentation

## 2013-09-30 LAB — HIV ANTIBODY (ROUTINE TESTING W REFLEX): HIV 1&2 Ab, 4th Generation: NONREACTIVE

## 2013-09-30 LAB — URINALYSIS, ROUTINE W REFLEX MICROSCOPIC
Bilirubin Urine: NEGATIVE
Glucose, UA: NEGATIVE mg/dL
Hgb urine dipstick: NEGATIVE
Ketones, ur: NEGATIVE mg/dL
NITRITE: POSITIVE — AB
PH: 7 (ref 5.0–8.0)
Protein, ur: NEGATIVE mg/dL
SPECIFIC GRAVITY, URINE: 1.027 (ref 1.005–1.030)
Urobilinogen, UA: 1 mg/dL (ref 0.0–1.0)

## 2013-09-30 LAB — GC/CHLAMYDIA PROBE AMP
CT Probe RNA: POSITIVE — AB
GC Probe RNA: NEGATIVE

## 2013-09-30 LAB — RPR

## 2013-09-30 LAB — URINE MICROSCOPIC-ADD ON

## 2013-09-30 MED ORDER — DOXYCYCLINE HYCLATE 100 MG PO CAPS: 100.0000 mg | ORAL_CAPSULE | Freq: Two times a day (BID) | ORAL | Status: DC

## 2013-09-30 MED ORDER — METRONIDAZOLE 500 MG PO TABS: 500.0000 mg | ORAL_TABLET | Freq: Two times a day (BID) | ORAL | Status: DC

## 2013-09-30 MED ORDER — LIDOCAINE HCL 1 % IJ SOLN
INTRAMUSCULAR | Status: AC
  Administered 2013-09-30: 9 mL
  Filled 2013-09-30: qty 20

## 2013-09-30 MED ORDER — CEFTRIAXONE SODIUM 250 MG IJ SOLR
250.0000 mg | Freq: Once | INTRAMUSCULAR | Status: AC
  Administered 2013-09-30: 250 mg via INTRAMUSCULAR
  Filled 2013-09-30: qty 250

## 2013-09-30 NOTE — ED Provider Notes (Signed)
CSN: [id number]     Arrival date & time 09/30/13  4098 History   First MD Initiated Contact with Patient 09/30/13 0703     Chief Complaint  Patient presents with  . Polyuria     (Consider location/radiation/quality/duration/timing/severity/associated sxs/prior Treatment) HPI  Patient states yesterday afternoon he started having irritation in his urethra when he urinated and started having frequency. He denies penile drip but also states "I don't look". He denies any abdominal pain, nausea, or vomiting. He denies any scrotal pain or masses. He does not describe excessive thirst. He states he had something similar earlier this year and was treated for urethritis. He denies any new sexual partner.   PCP none   Past Medical History  Diagnosis Date  . Inguinal hernia, unilateral   . Ankle injury    Past Surgical History  Procedure Laterality Date  . Inguinal hernia repair     Family History  Problem Relation Age of Onset  . Hypertension Mother   . Hypertension Father    History  Substance Use Topics  . Smoking status: Never Smoker   . Smokeless tobacco: Not on file  . Alcohol Use: No  just graduated from college  Review of Systems  All other systems reviewed and are negative.     Allergies  Codeine  Home Medications   Prior to Admission medications   Medication Sig Start Date End Date Taking? Authorizing Provider  doxycycline (VIBRAMYCIN) 100 MG capsule Take 1 capsule (100 mg total) by mouth 2 (two) times daily. 09/30/13   Ward Givens, MD  metroNIDAZOLE (FLAGYL) 500 MG tablet Take 1 tablet (500 mg total) by mouth 2 (two) times daily. 09/30/13   Ward Givens, MD  naproxen (NAPROSYN) 500 MG tablet Take 500 mg by mouth as needed for moderate pain.    Historical Provider, MD  ondansetron (ZOFRAN ODT) 4 MG disintegrating tablet  ODT q4 hours prn nausea/vomit 08/07/13   Hannah Muthersbaugh, PA-C   BP 129/73  Pulse 96  Temp(Src) 98.4 F (36.9 C) (Oral)  Resp 18  Ht 5'  11" (1.803 m)  Wt 167 lb (75.751 kg)  BMI 23.30 kg/m2  SpO2 100%  Vital signs normal   Physical Exam  Nursing note and vitals reviewed. Constitutional: He is oriented to person, place, and time. He appears well-developed and well-nourished.  Non-toxic appearance. He does not appear ill. No distress.  HENT:  Head: Normocephalic and atraumatic.  Right Ear: External ear normal.  Left Ear: External ear normal.  Nose: Nose normal. No mucosal edema or rhinorrhea.  Mouth/Throat: Mucous membranes are normal. No dental abscesses or uvula swelling.  Eyes: Conjunctivae and EOM are normal. Pupils are equal, round, and reactive to light.  Neck: Normal range of motion and full passive range of motion without pain. Neck supple.  Pulmonary/Chest: Effort normal. No respiratory distress. He has no rhonchi. He exhibits no crepitus.  Abdominal: Soft. Normal appearance and bowel sounds are normal. He exhibits no distension. There is no tenderness. There is no rebound and no guarding.  Genitourinary:  Normal external genitalia. Has a white penile drip. Nontender testicles without masses. Nontender epididymis bilaterally. No skin lesions seen.    Musculoskeletal: Normal range of motion. He exhibits no edema and no tenderness.  Moves all extremities well.   Neurological: He is alert and oriented to person, place, and time. He has normal strength. No cranial nerve deficit.  Skin: Skin is warm, dry and intact. No rash noted. No erythema. No  pallor.  Psychiatric: He has a normal mood and affect. His speech is normal and behavior is normal. His mood appears not anxious.    ED Course  Procedures (including critical care time) Medications  cefTRIAXone (ROCEPHIN) injection 250 mg (250 mg Intramuscular Given 09/30/13 0741)  lidocaine (XYLOCAINE) 1 % (with pres) injection (9 mLs  Given 09/30/13 0741)    Patient obtained his own urethral swab with my supervision, he refused to let me do it. Review of his prior  testing shows in February 2015 he was positive for Chlamydia. His RPR was negative. He states his sexual partner was examined and did not have an infection.   Labs Review Results for orders placed during the hospital encounter of 09/30/13  URINALYSIS, ROUTINE W REFLEX MICROSCOPIC      Result Value Ref Range   Color, Urine ORANGE (*) YELLOW   APPearance CLOUDY (*) CLEAR   Specific Gravity, Urine 1.027  1.005 - 1.030   pH 7.0  5.0 - 8.0   Glucose, UA NEGATIVE  NEGATIVE mg/dL   Hgb urine dipstick NEGATIVE  NEGATIVE   Bilirubin Urine NEGATIVE  NEGATIVE   Ketones, ur NEGATIVE  NEGATIVE mg/dL   Protein, ur NEGATIVE  NEGATIVE mg/dL   Urobilinogen, UA 1.0  0.0 - 1.0 mg/dL   Nitrite POSITIVE (*) NEGATIVE   Leukocytes, UA SMALL (*) NEGATIVE  URINE MICROSCOPIC-ADD ON      Result Value Ref Range   Squamous Epithelial / LPF RARE  RARE   WBC, UA 7-10  <3 WBC/hpf   RBC / HPF 0-2  <3 RBC/hpf   Bacteria, UA FEW (*) RARE   Urine-Other MUCOUS PRESENT     Laboratory interpretation all normal except +UA     Imaging Review No results found.   EKG Interpretation None      MDM   Final diagnoses:  Urethritis    New Prescriptions   DOXYCYCLINE (VIBRAMYCIN) 100 MG CAPSULE    Take 1 capsule (100 mg total) by mouth 2 (two) times daily.   METRONIDAZOLE (FLAGYL) 500 MG TABLET    Take 1 tablet (500 mg total) by mouth 2 (two) times daily.    Plan discharge  Devoria Albe, MD, Franz Dell, MD 09/30/13 [phone]

## 2013-09-30 NOTE — Discharge Instructions (Signed)
Take the antibiotics until gone. Have your sexual partner get checked for a STD. Return if you get abdominal pain, pain in your scrotum or testicles, fever or you seem worse.

## 2013-09-30 NOTE — ED Notes (Signed)
Pt states that he has had increased urinary frequency since yesterday afternoon. Denies any dysuria or hematuria. Also denies any pain. Thinks he has a UTI.

## 2013-10-01 ENCOUNTER — Telehealth (HOSPITAL_BASED_OUTPATIENT_CLINIC_OR_DEPARTMENT_OTHER): Payer: Self-pay | Admitting: Emergency Medicine

## 2013-10-01 NOTE — Telephone Encounter (Signed)
Post ED Visit - Positive Culture Follow-up   Positive Chlamydia culture Treated with Doxycycline 100 mg BID x ten days and Rocephin IM, organism sensitive to the same and no further patient follow-up is required at this time.  10/01/13 @ 1035 - attempt to contact patient, left voicemail to call flow managers #  Ronald Salas 10/01/2013, 10:35 AM

## 2013-10-08 ENCOUNTER — Telehealth (HOSPITAL_BASED_OUTPATIENT_CLINIC_OR_DEPARTMENT_OTHER): Payer: Self-pay | Admitting: Emergency Medicine

## 2013-10-10 ENCOUNTER — Telehealth (HOSPITAL_BASED_OUTPATIENT_CLINIC_OR_DEPARTMENT_OTHER): Payer: Self-pay | Admitting: Emergency Medicine

## 2013-10-11 NOTE — Telephone Encounter (Signed)
Unable to contact patient via phone. Sent letter. °

## 2013-10-29 ENCOUNTER — Telehealth (HOSPITAL_COMMUNITY): Payer: Self-pay

## 2013-10-29 NOTE — ED Notes (Signed)
Unable to contact pt by mail or telephone. Unable to communicate lab results or treatment changes. 

## 2013-11-19 IMAGING — CR DG ANKLE COMPLETE 3+V*R*
3 series · 3 of 3 positions shown · non-contrast
Comparison: 02/04/2012

CLINICAL DATA: Right ankle pain

EXAM:
RIGHT ANKLE - COMPLETE 3+ VIEW

[x ankle ap right]
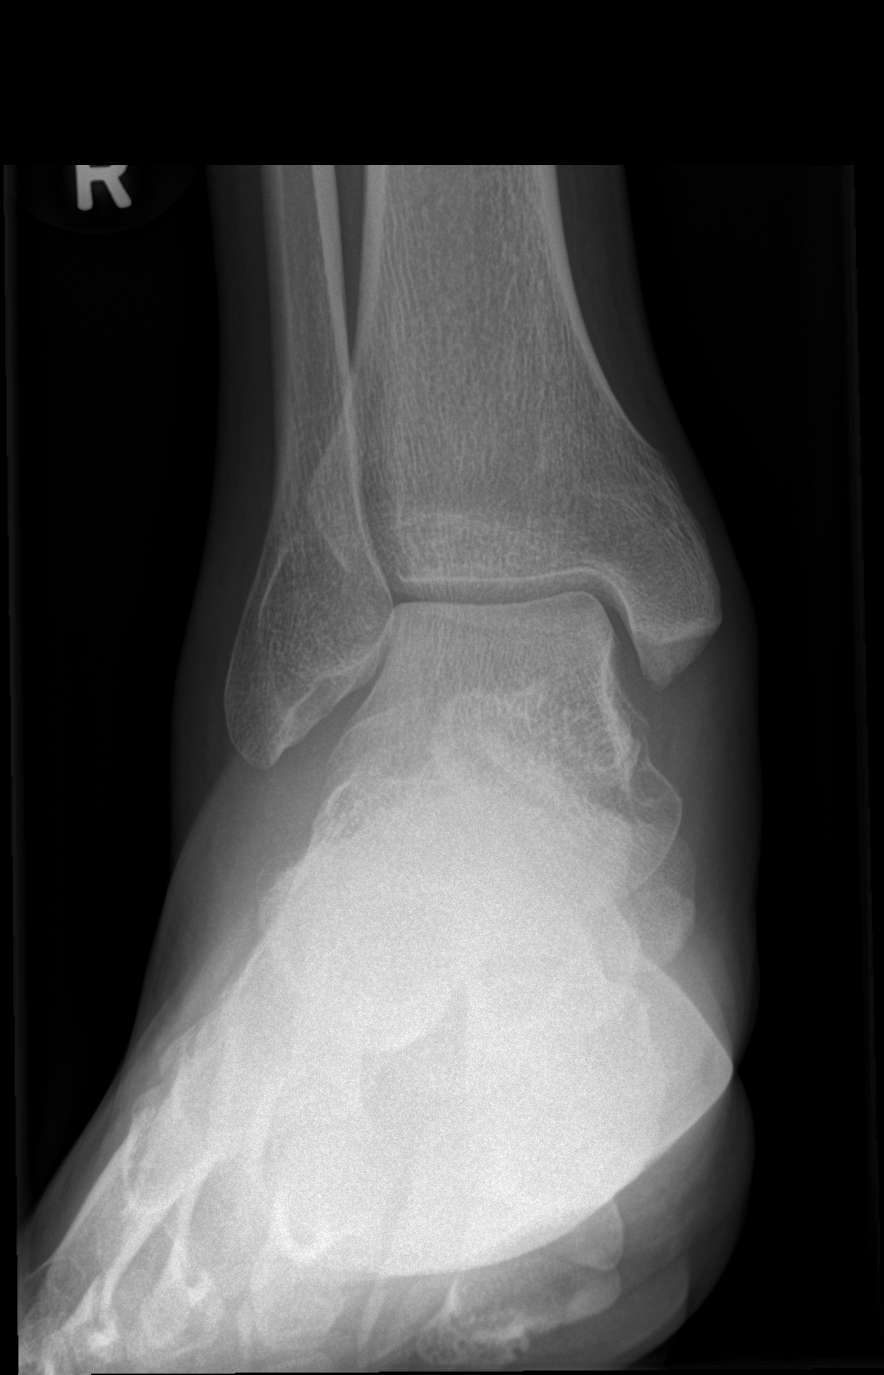

[x ankle obl right]
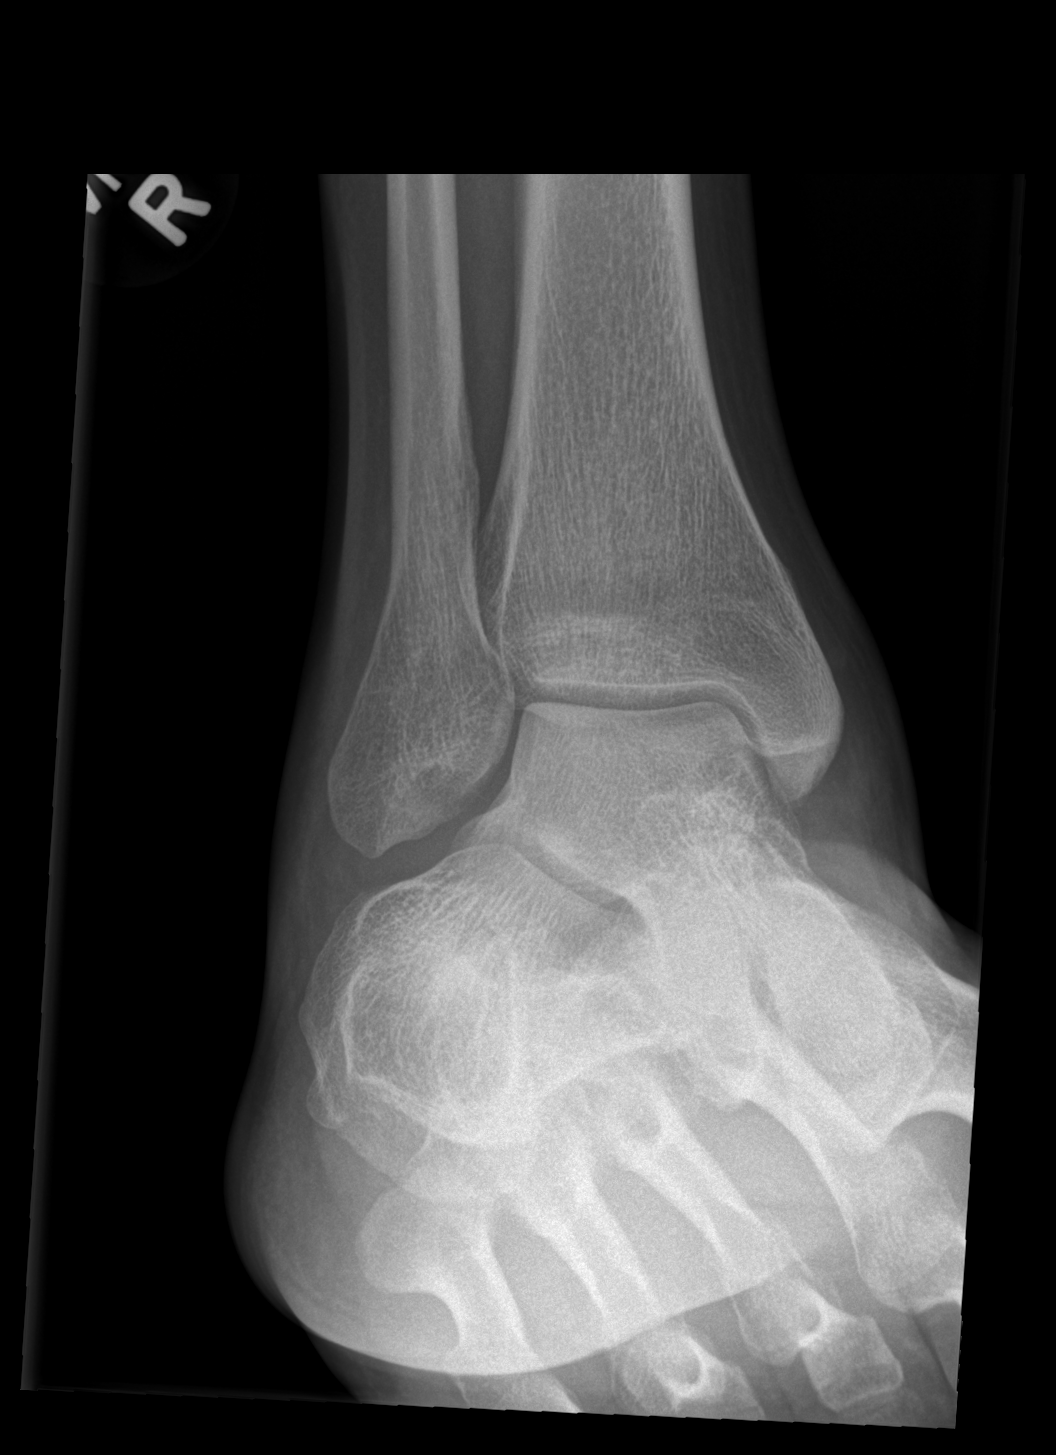

[x ankle lat right]
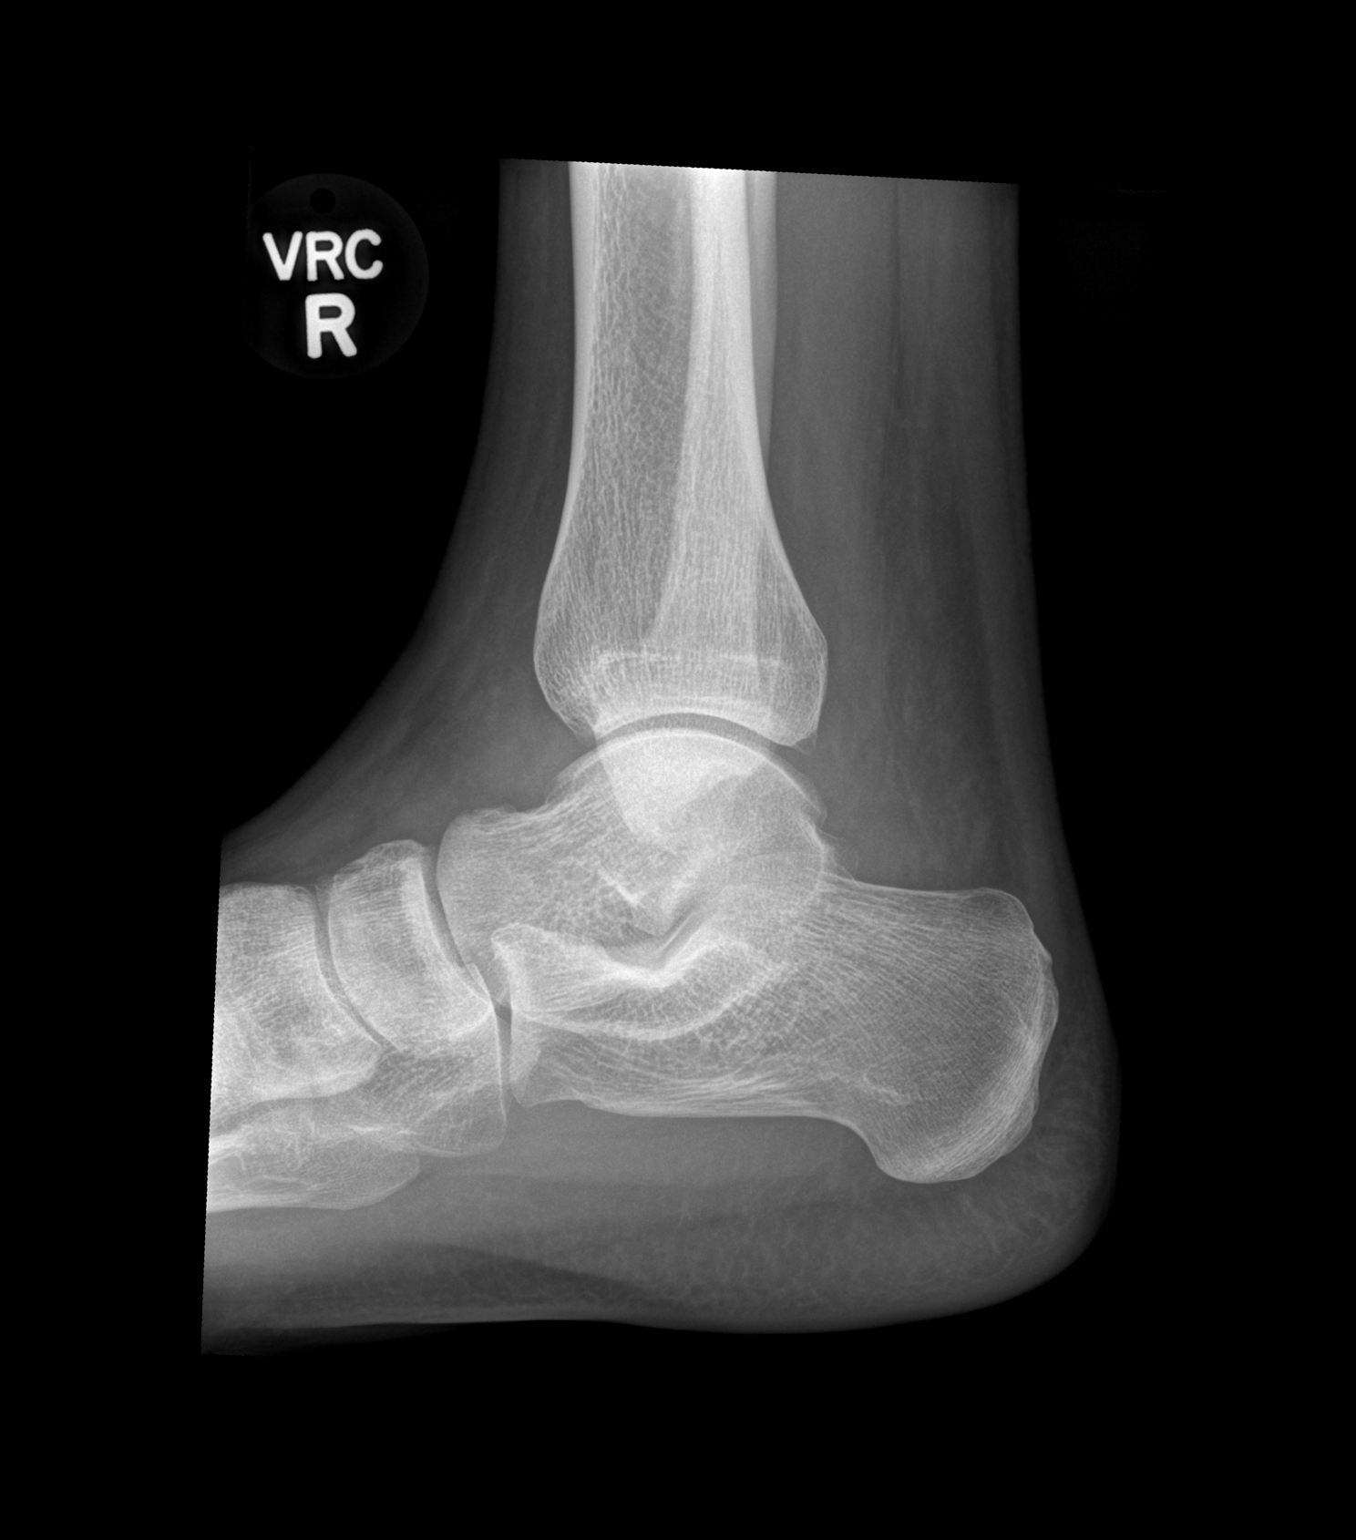

[3 of 3 positions shown; findings below may reference images not displayed]

FINDINGS: Generalized soft tissue swelling is noted. No acute fracture or
dislocation is seen.
IMPRESSION: Soft tissue swelling without acute bony abnormality.

## 2013-11-27 IMAGING — CR DG FOOT COMPLETE 3+V*R*
3 series · 3 of 3 positions shown · non-contrast
Comparison: None.

CLINICAL DATA: Right foot pain after injury.

EXAM:
RIGHT FOOT COMPLETE - 3+ VIEW

[x foot ap right]
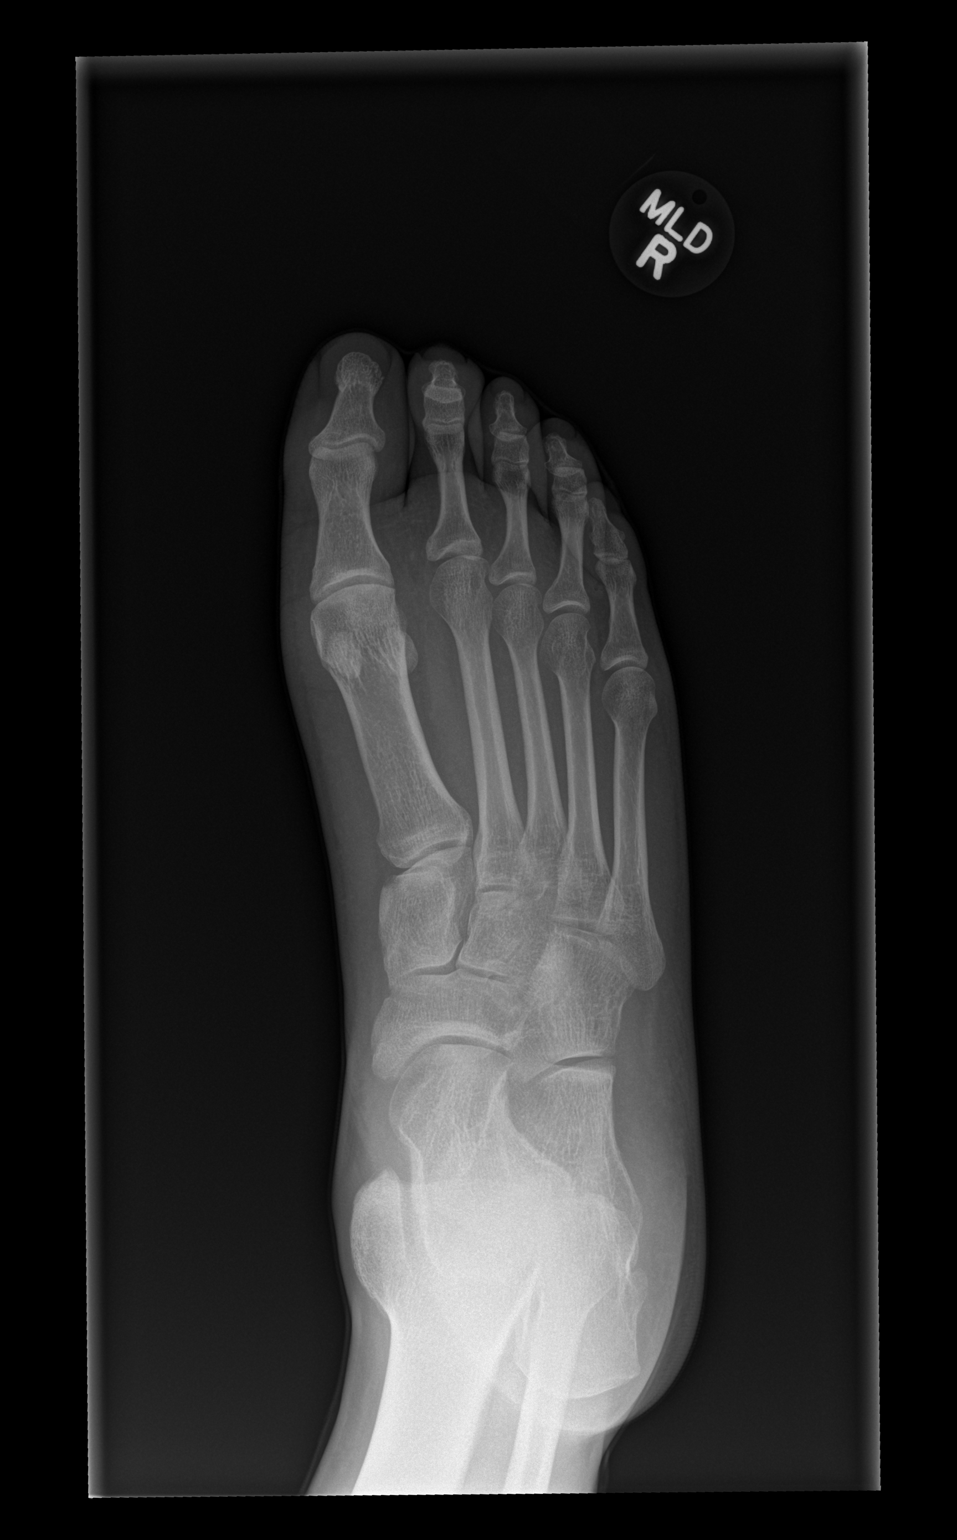

[x foot obl right]
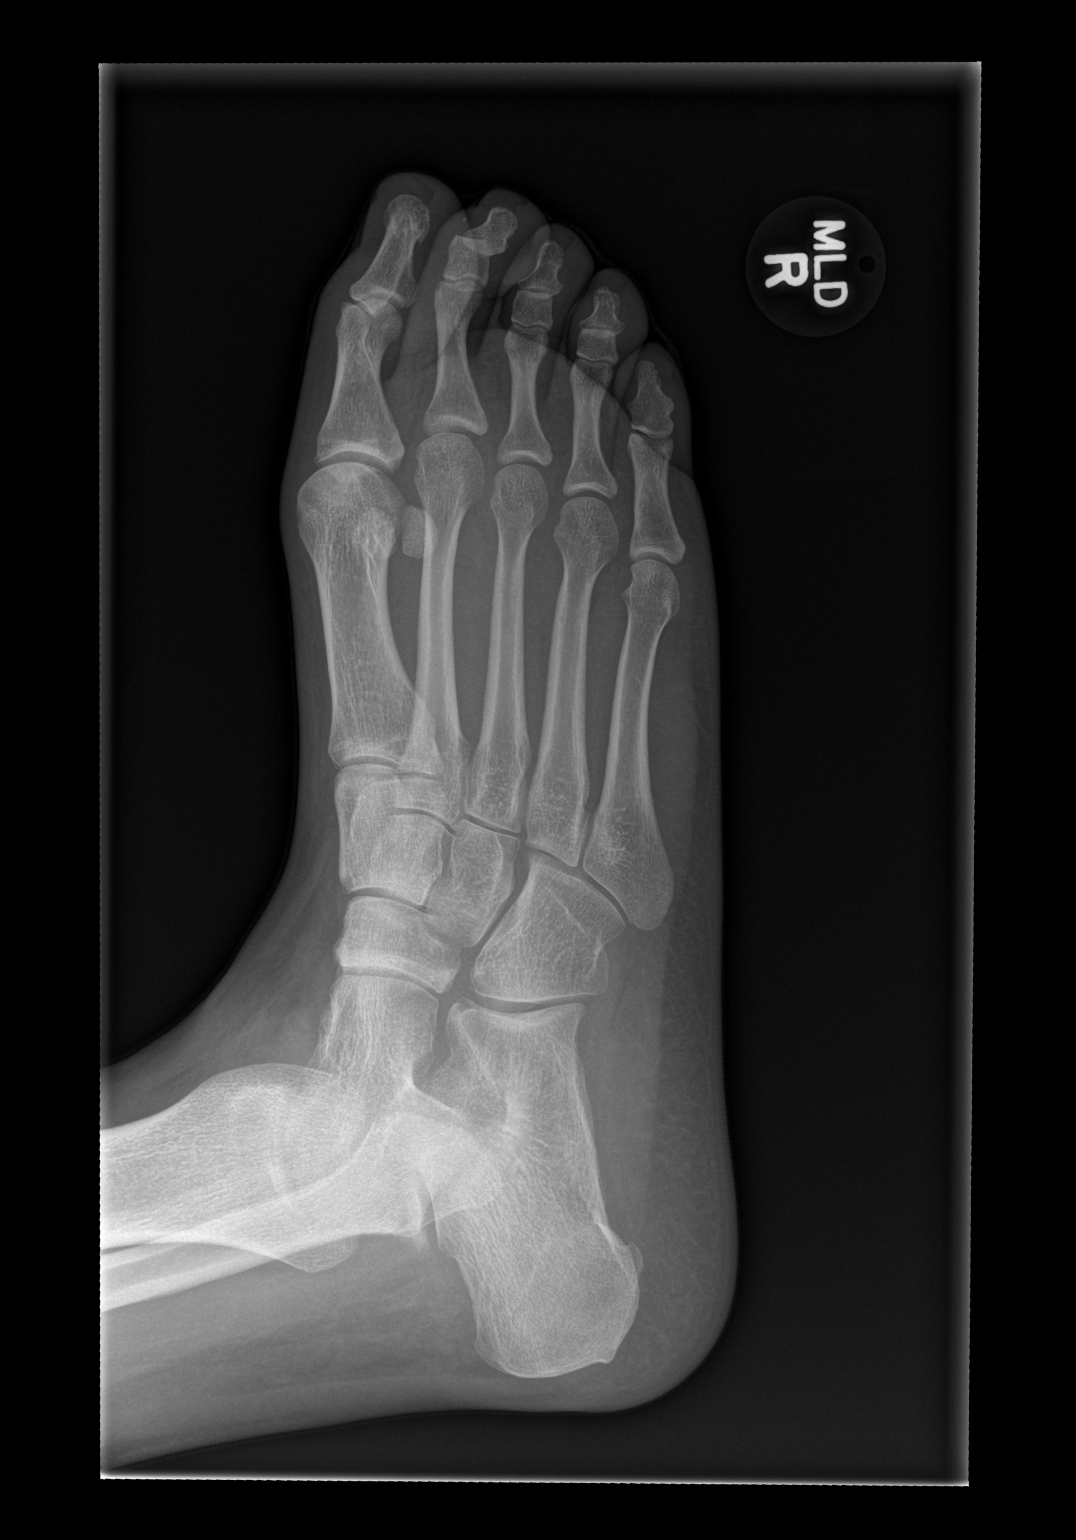

[x foot lat right]
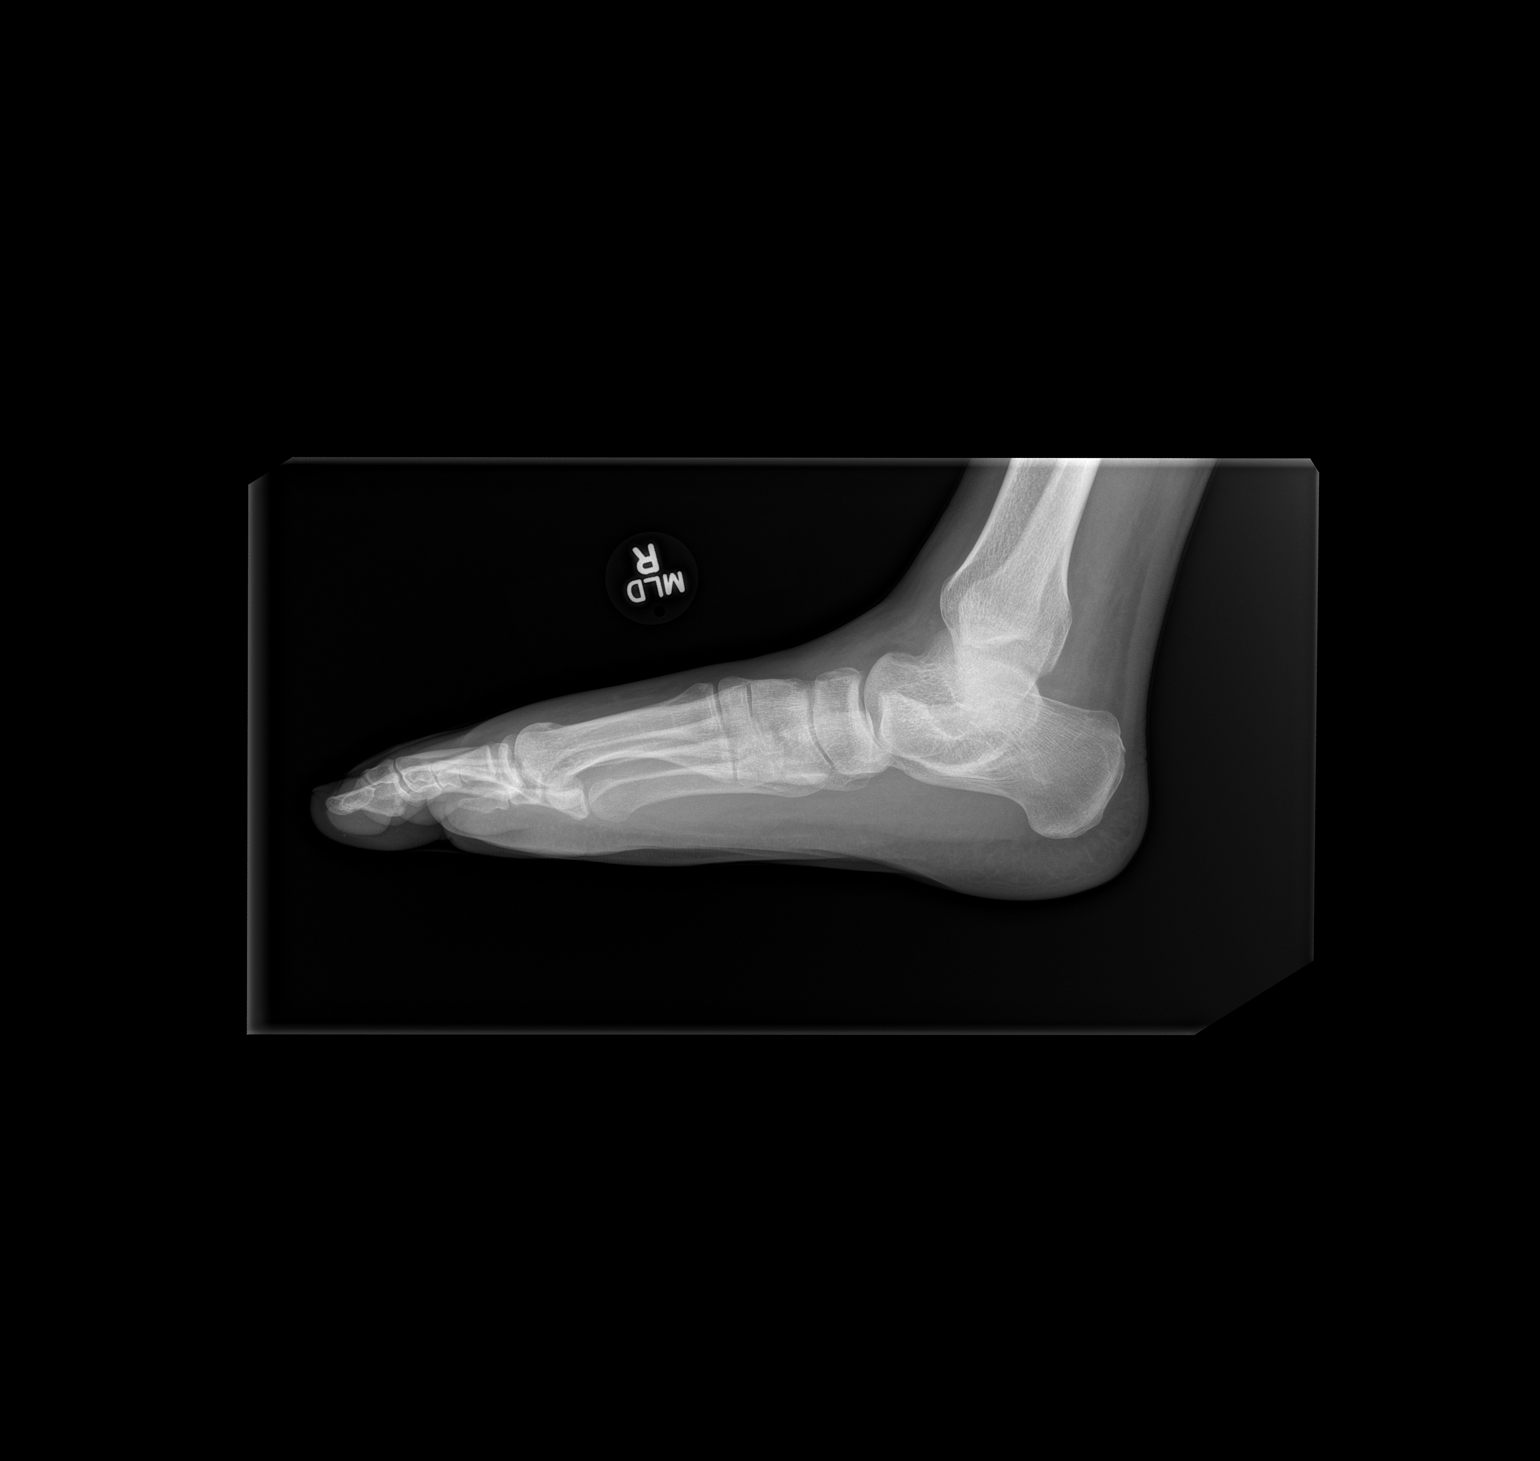

[3 of 3 positions shown; findings below may reference images not displayed]

FINDINGS: There is no evidence of fracture or dislocation. Pes planus
deformity is noted. There is no evidence of arthropathy or other
focal bone abnormality. Soft tissues are unremarkable.
IMPRESSION: No acute abnormality seen in the right foot.

## 2014-03-23 ENCOUNTER — Emergency Department (HOSPITAL_COMMUNITY)
Admission: EM | Admit: 2014-03-23 | Discharge: 2014-03-24 | Disposition: A | Discharge status: Home / Self Care | Payer: Self-pay | Attending: Emergency Medicine | Admitting: Emergency Medicine

## 2014-03-23 DIAGNOSIS — Z8719 Personal history of other diseases of the digestive system: Secondary | ICD-10-CM | POA: Insufficient documentation

## 2014-03-23 DIAGNOSIS — R350 Frequency of micturition: Secondary | ICD-10-CM | POA: Insufficient documentation

## 2014-03-23 DIAGNOSIS — Z87828 Personal history of other (healed) physical injury and trauma: Secondary | ICD-10-CM | POA: Insufficient documentation

## 2014-03-23 NOTE — ED Notes (Signed)
Pt arrived to the Ed with a complaint of urinary frequency for two days.  Pt denies painful urination.  Pt has experienced symptoms for 2 days.

## 2014-03-24 ENCOUNTER — Encounter (HOSPITAL_COMMUNITY): Payer: Self-pay | Admitting: Emergency Medicine

## 2014-03-24 LAB — CBC
HEMATOCRIT: 44.1 % (ref 39.0–52.0)
HEMOGLOBIN: 15.4 g/dL (ref 13.0–17.0)
MCH: 32 pg (ref 26.0–34.0)
MCHC: 34.9 g/dL (ref 30.0–36.0)
MCV: 91.5 fL (ref 78.0–100.0)
Platelets: 260 10*3/uL (ref 150–400)
RBC: 4.82 MIL/uL (ref 4.22–5.81)
RDW: 12.3 % (ref 11.5–15.5)
WBC: 4.8 10*3/uL (ref 4.0–10.5)

## 2014-03-24 LAB — URINALYSIS, ROUTINE W REFLEX MICROSCOPIC
BILIRUBIN URINE: NEGATIVE
GLUCOSE, UA: NEGATIVE mg/dL
Hgb urine dipstick: NEGATIVE
Ketones, ur: NEGATIVE mg/dL
LEUKOCYTES UA: NEGATIVE
NITRITE: NEGATIVE
PH: 7.5 (ref 5.0–8.0)
Protein, ur: NEGATIVE mg/dL
Specific Gravity, Urine: 1.021 (ref 1.005–1.030)
UROBILINOGEN UA: 0.2 mg/dL (ref 0.0–1.0)

## 2014-03-24 LAB — COMPREHENSIVE METABOLIC PANEL
ALBUMIN: 4.1 g/dL (ref 3.5–5.2)
ALK PHOS: 65 U/L (ref 39–117)
ALT: 23 U/L (ref 0–53)
AST: 23 U/L (ref 0–37)
Anion gap: 6 (ref 5–15)
BUN: 9 mg/dL (ref 6–23)
CO2: 30 mmol/L (ref 19–32)
Calcium: 9.2 mg/dL (ref 8.4–10.5)
Chloride: 101 mmol/L (ref 96–112)
Creatinine, Ser: 0.91 mg/dL (ref 0.50–1.35)
GFR calc Af Amer: >90 mL/min (ref >90)
GFR calc non Af Amer: >90 mL/min (ref >90)
Glucose, Bld: 83 mg/dL (ref 70–99)
POTASSIUM: 4.1 mmol/L (ref 3.5–5.1)
SODIUM: 137 mmol/L (ref 135–145)
TOTAL PROTEIN: 7.5 g/dL (ref 6.0–8.3)
Total Bilirubin: 0.5 mg/dL (ref 0.3–1.2)

## 2014-03-24 MED ORDER — DOXYCYCLINE HYCLATE 100 MG PO TABS
100.0000 mg | ORAL_TABLET | Freq: Once | ORAL | Status: AC
  Administered 2014-03-24: 100 mg via ORAL
  Filled 2014-03-24: qty 1

## 2014-03-24 MED ORDER — PHENAZOPYRIDINE HCL 100 MG PO TABS: 100.0000 mg | ORAL_TABLET | Freq: Three times a day (TID) | ORAL | Status: DC

## 2014-03-24 MED ORDER — PHENAZOPYRIDINE HCL 100 MG PO TABS
100.0000 mg | ORAL_TABLET | Freq: Three times a day (TID) | ORAL | Status: DC
  Administered 2014-03-24: 100 mg via ORAL
  Filled 2014-03-24: qty 1

## 2014-03-24 MED ORDER — DOXYCYCLINE HYCLATE 100 MG PO TABS: 100.0000 mg | ORAL_TABLET | Freq: Two times a day (BID) | ORAL | Status: DC

## 2014-03-24 NOTE — Discharge Instructions (Signed)
Urinary Frequency °The number of times a normal person urinates depends upon how much liquid they take in and how much liquid they are losing. If the temperature is hot and there is high humidity, then the person will sweat more and usually breathe a little more frequently. These factors decrease the amount of frequency of urination that would be considered normal. °The amount you drink is easily determined, but the amount of fluid lost is sometimes more difficult to calculate.  °Fluid is lost in two ways: °· Sensible fluid loss is usually measured by the amount of urine that you get rid of. Losses of fluid can also occur with diarrhea. °· Insensible fluid loss is more difficult to measure. It is caused by evaporation. Insensible loss of fluid occurs through breathing and sweating. It usually ranges from a little less than a quart to a little more than a quart of fluid a day. °In normal temperatures and activity levels, the average person may urinate 4 to 7 times in a 24-hour period. Needing to urinate more often than that could indicate a problem. If one urinates 4 to 7 times in 24 hours and has large volumes each time, that could indicate a different problem from one who urinates 4 to 7 times a day and has small volumes. The time of urinating is also important. Most urinating should be done during the waking hours. Getting up at night to urinate frequently can indicate some problems. °CAUSES  °The bladder is the organ in your lower abdomen that holds urine. Like a balloon, it swells some as it fills up. Your nerves sense this and tell you it is time to head for the bathroom. There are a number of reasons that you might feel the need to urinate more often than usual. They include: °· Urinary tract infection. This is usually associated with other signs such as burning when you urinate. °· In men, problems with the prostate (a walnut-size gland that is located near the tube that carries urine out of your body). There  are two reasons why the prostate can cause an increased frequency of urination: °¨ An enlarged prostate that does not let the bladder empty well. If the bladder only half empties when you urinate, then it only has half the capacity to fill before you have to urinate again. °¨ The nerves in the bladder become more hypersensitive with an increased size of the prostate even if the bladder empties completely. °· Pregnancy. °· Obesity. Excess weight is more likely to cause a problem for women than for men. °· Bladder stones or other bladder problems. °· Caffeine. °· Alcohol. °· Medications. For example, drugs that help the body get rid of extra fluid (diuretics) increase urine production. Some other medicines must be taken with lots of fluids. °· Muscle or nerve weakness. This might be the result of a spinal cord injury, a stroke, multiple sclerosis, or Parkinson disease. °· Long-standing diabetes can decrease the sensation of the bladder. This loss of sensation makes it harder to sense the bladder needs to be emptied. Over a period of years, the bladder is stretched out by constant overfilling. This weakens the bladder muscles so that the bladder does not empty well and has less capacity to fill with new urine. °· Interstitial cystitis (also called painful bladder syndrome). This condition develops because the tissues that line the inside of the bladder are inflamed (inflammation is the body's way of reacting to injury or infection). It causes pain and frequent   urination. It occurs in women more often than in men. DIAGNOSIS   To decide what might be causing your urinary frequency, your health care provider will probably:  Ask about symptoms you have noticed.  Ask about your overall health. This will include questions about any medications you are taking.  Do a physical examination.  Order some tests. These might include:  A blood test to check for diabetes or other health issues that could be contributing  to the problem.  Urine testing. This could measure the flow of urine and the pressure on the bladder.  A test of your neurological system (the brain, spinal cord, and nerves). This is the system that senses the need to urinate.  A bladder test to check whether it is emptying completely when you urinate.  Cystoscopy. This test uses a thin tube with a tiny camera on it. It offers a look inside your urethra and bladder to see if there are problems.  Imaging tests. You might be given a contrast dye and then asked to urinate. X-rays are taken to see how your bladder is working. TREATMENT  It is important for you to be evaluated to determine if the amount or frequency that you have is unusual or abnormal. If it is found to be abnormal, the cause should be determined and this can usually be found out easily. Depending upon the cause, treatment could include medication, stimulation of the nerves, or surgery. There are not too many things that you can do as an individual to change your urinary frequency. It is important that you balance the amount of fluid intake needed to compensate for your activity and the temperature. Medical problems will be diagnosed and taken care of by your physician. There is no particular bladder training such as Kegel exercises that you can do to help urinary frequency. This is an exercise that is usually recommended for people who have leaking of urine when they laugh, cough, or sneeze. HOME CARE INSTRUCTIONS   Take any medications your health care provider prescribed or suggested. Follow the directions carefully.  Practice any lifestyle changes that are recommended. These might include:  Drinking less fluid or drinking at different times of the day. If you need to urinate often during the night, for example, you may need to stop drinking fluids early in the evening.  Cutting down on caffeine or alcohol. They both can make you need to urinate more often than normal. Caffeine  is found in coffee, tea, and sodas.  Losing weight, if that is recommended.  Keep a journal or a log. You might be asked to record how much you drink and when and where you feel the need to urinate. This will also help evaluate how well the treatment provided by your physician is working. SEEK MEDICAL CARE IF:   Your need to urinate often gets worse.  You feel increased pain or irritation when you urinate.  You notice blood in your urine.  You have questions about any medications that your health care provider recommended.  You notice blood, pus, or swelling at the site of any test or treatment procedure.  You develop a fever of more than 100.5F (38.1C). SEEK IMMEDIATE MEDICAL CARE IF:  You develop a fever of more than 102.0F (38.9C). Document Released: 11/18/2008 Document Revised: 06/08/2013 Document Reviewed: 11/18/2008 Exit[employer] Patient Information 2015 [employer]. This information is not intended to replace advice given to you by your health care provider. Make sure you discuss any questions you   have with your health care provider. Please make an appointment with urology for follow-up

## 2014-03-24 NOTE — ED Provider Notes (Signed)
CSN: [id number]     Arrival date & time 03/23/14  2318 History   First MD Initiated Contact with Patient 03/24/14 [phone]     Chief Complaint  Patient presents with  . Urinary Frequency     (Consider location/radiation/quality/duration/timing/severity/associated sxs/prior Treatment) HPI Comments: Patient states he's had frequency for 2 days.  He has no dysuria, no penile discharge denies any intercourse in the past 6 weeks he has had oral sex but no vaginal or anal intercourse.  Denies any fever, constipation or diarrhea.  No abdominal pain, testicular pain, penile pain, scrotal swelling  Patient is a 29 y.o. male presenting with frequency. The history is provided by the patient.  Urinary Frequency This is a new problem. The problem occurs intermittently. The problem has been unchanged. Associated symptoms include urinary symptoms. Pertinent negatives include no abdominal pain, anorexia, fever, nausea, numbness or weakness. Nothing aggravates the symptoms. He has tried nothing for the symptoms. The treatment provided no relief.    Past Medical History  Diagnosis Date  . Inguinal hernia, unilateral   . Ankle injury    Past Surgical History  Procedure Laterality Date  . Inguinal hernia repair     Family History  Problem Relation Age of Onset  . Hypertension Mother   . Hypertension Father    History  Substance Use Topics  . Smoking status: Never Smoker   . Smokeless tobacco: Not on file  . Alcohol Use: No    Review of Systems  Constitutional: Negative for fever.  Gastrointestinal: Negative for nausea, abdominal pain, diarrhea, constipation, abdominal distention and anorexia.  Genitourinary: Positive for frequency. Negative for dysuria, hematuria, discharge, penile swelling, scrotal swelling, genital sores, penile pain and testicular pain.  Musculoskeletal: Negative for back pain.  Neurological: Negative for weakness and numbness.  All other systems reviewed and are  negative.     Allergies  Codeine  Home Medications   Prior to Admission medications   Medication Sig Start Date End Date Taking? Authorizing Provider  doxycycline (VIBRA-TABS) 100 MG tablet Take 1 tablet (100 mg total) by mouth 2 (two) times daily. 03/24/14   Arman FilterGail K Darrien Belter, NP  phenazopyridine (PYRIDIUM) 100 MG tablet Take 1 tablet (100 mg total) by mouth 3 (three) times daily with meals. 03/24/14   Arman FilterGail K Sharnise Blough, NP   BP 134/56 mmHg  Pulse 60  Temp(Src) 98.3 F (36.8 C) (Oral)  Resp 18  Ht 5\' 11"  (1.803 m)  Wt 175 lb (79.379 kg)  BMI 24.42 kg/m2  SpO2 100% Physical Exam  Constitutional: He is oriented to person, place, and time. He appears well-developed and well-nourished.  HENT:  Head: Normocephalic.  Eyes: Pupils are equal, round, and reactive to light.  Neck: Normal range of motion.  Cardiovascular: Normal rate and regular rhythm.   Pulmonary/Chest: Effort normal and breath sounds normal.  Abdominal: Soft. Bowel sounds are normal. He exhibits no distension. There is no tenderness.  Genitourinary: No penile tenderness.  Musculoskeletal: Normal range of motion.  Neurological: He is alert and oriented to person, place, and time.  Skin: Skin is warm and dry. No erythema.  Nursing note and vitals reviewed.   ED Course  Procedures (including critical care time) Labs Review Labs Reviewed  COMPREHENSIVE METABOLIC PANEL  CBC  URINALYSIS, ROUTINE W REFLEX MICROSCOPIC    Imaging Review No results found.   EKG Interpretation None     Will treat with Doxycycline and pyridium, refer to GU for FU MDM   Final diagnoses:  Urinary  frequency         Arman Filter, NP 03/24/14 1610  Olivia Mackie, MD 03/24/14 [phone]

## 2015-04-30 ENCOUNTER — Encounter (HOSPITAL_COMMUNITY): Payer: Self-pay | Admitting: Emergency Medicine

## 2015-04-30 ENCOUNTER — Emergency Department (HOSPITAL_COMMUNITY)
Admission: EM | Admit: 2015-04-30 | Discharge: 2015-04-30 | Disposition: A | Discharge status: Home / Self Care | Payer: Self-pay | Attending: Emergency Medicine | Admitting: Emergency Medicine

## 2015-04-30 DIAGNOSIS — R350 Frequency of micturition: Secondary | ICD-10-CM | POA: Insufficient documentation

## 2015-04-30 LAB — URINE MICROSCOPIC-ADD ON

## 2015-04-30 LAB — URINALYSIS, ROUTINE W REFLEX MICROSCOPIC
Bilirubin Urine: NEGATIVE
Glucose, UA: NEGATIVE mg/dL
Ketones, ur: NEGATIVE mg/dL
Leukocytes, UA: NEGATIVE
NITRITE: NEGATIVE
Protein, ur: NEGATIVE mg/dL
Specific Gravity, Urine: 1.028 (ref 1.005–1.030)
pH: 5.5 (ref 5.0–8.0)

## 2015-04-30 NOTE — ED Notes (Signed)
Pt states that on Wednesday he began having increase in urinary frequency. Pt denies pain, but states there is a "little irritation" when he urinates. Pt denies flank pain, n,v,d. Pt denies discharge. Pt denies fever and chills.

## 2015-05-05 ENCOUNTER — Emergency Department (HOSPITAL_COMMUNITY)
Admission: EM | Admit: 2015-05-05 | Discharge: 2015-05-05 | Disposition: A | Discharge status: Home / Self Care | Payer: Self-pay | Attending: Emergency Medicine | Admitting: Emergency Medicine

## 2015-05-05 ENCOUNTER — Encounter (HOSPITAL_COMMUNITY): Payer: Self-pay | Admitting: *Deleted

## 2015-05-05 DIAGNOSIS — Z79899 Other long term (current) drug therapy: Secondary | ICD-10-CM | POA: Insufficient documentation

## 2015-05-05 DIAGNOSIS — N390 Urinary tract infection, site not specified: Secondary | ICD-10-CM | POA: Insufficient documentation

## 2015-05-05 DIAGNOSIS — Z87828 Personal history of other (healed) physical injury and trauma: Secondary | ICD-10-CM | POA: Insufficient documentation

## 2015-05-05 DIAGNOSIS — Z8719 Personal history of other diseases of the digestive system: Secondary | ICD-10-CM | POA: Insufficient documentation

## 2015-05-05 DIAGNOSIS — Z792 Long term (current) use of antibiotics: Secondary | ICD-10-CM | POA: Insufficient documentation

## 2015-05-05 DIAGNOSIS — R319 Hematuria, unspecified: Secondary | ICD-10-CM

## 2015-05-05 LAB — URINALYSIS, ROUTINE W REFLEX MICROSCOPIC
GLUCOSE, UA: NEGATIVE mg/dL
KETONES UR: 15 mg/dL — AB
Nitrite: POSITIVE — AB
PH: 5 (ref 5.0–8.0)
Protein, ur: NEGATIVE mg/dL
SPECIFIC GRAVITY, URINE: 1.029 (ref 1.005–1.030)

## 2015-05-05 LAB — URINE MICROSCOPIC-ADD ON

## 2015-05-05 LAB — CBG MONITORING, ED: Glucose-Capillary: 96 mg/dL (ref 65–99)

## 2015-05-05 MED ORDER — CIPROFLOXACIN HCL 500 MG PO TABS: 500.0000 mg | ORAL_TABLET | Freq: Two times a day (BID) | ORAL | Status: DC

## 2015-05-05 NOTE — ED Notes (Signed)
CBG: 96. RN notified. 

## 2015-05-05 NOTE — ED Notes (Signed)
Pt has had same symptoms in the past and it was UTI

## 2015-05-05 NOTE — Discharge Instructions (Signed)

## 2015-05-05 NOTE — ED Notes (Signed)
Urinary frequency without pain or burning or discharge.  Pt denies any new sexual partners but states that he had recent oral sex without a condom.

## 2015-05-05 NOTE — ED Provider Notes (Signed)
CSN: [id number]     Arrival date & time 05/05/15  1924 History  By signing my name below, I, Tanda RockersMargaux Venter, attest that this documentation has been prepared under the direction and in the presence of Felicie Mornavid Skyeler Smola, NP. Electronically Signed: Tanda RockersMargaux Venter, ED Scribe. 05/05/2015. 7:50 PM.   Chief Complaint  Patient presents with  . Urinary Frequency   Patient is a 30 y.o. male presenting with frequency. The history is provided by the patient. No language interpreter was used.  Urinary Frequency This is a new problem. The current episode started more than 2 days ago. The problem occurs rarely. The problem has not changed since onset.Pertinent negatives include no abdominal pain. Treatments tried: Azo tablets. The treatment provided mild relief.     HPI Comments: Ronald Salas is a 30 y.o. male who presents to the Emergency Department complaining of constant urinary frequency x 1 week. Pt estimates urinating about 7-8 times per day. He has been taking Azo tablets with some relief. Pt has had similar symptoms in the past when he had a UTI. He mentions recently being tested for STDs at the health department with no acute findings. Pt is sexually active but uses protection every time. FHx DM. Denies abdominal pain, nausea, vomiting, polydipsia, polyphagia, penile discharge, or any other associated symptoms.    Past Medical History  Diagnosis Date  . Inguinal hernia, unilateral   . Ankle injury    Past Surgical History  Procedure Laterality Date  . Inguinal hernia repair     Family History  Problem Relation Age of Onset  . Hypertension Mother   . Hypertension Father    Social History  Substance Use Topics  . Smoking status: Never Smoker   . Smokeless tobacco: None  . Alcohol Use: No    Review of Systems  Constitutional: Negative for fever.  Gastrointestinal: Negative for nausea, vomiting and abdominal pain.  Endocrine: Negative for polydipsia and polyphagia.  Genitourinary:  Positive for frequency. Negative for discharge.  All other systems reviewed and are negative.   Allergies  Codeine  Home Medications   Prior to Admission medications   Medication Sig Start Date End Date Taking? Authorizing Provider  doxycycline (VIBRA-TABS) 100 MG tablet Take 1 tablet (100 mg total) by mouth 2 (two) times daily. 03/24/14   Earley FavorGail Schulz, NP  phenazopyridine (PYRIDIUM) 100 MG tablet Take 1 tablet (100 mg total) by mouth 3 (three) times daily with meals. 03/24/14   Earley FavorGail Schulz, NP   BP 126/78 mmHg  Pulse 63  Temp(Src) 98.5 F (36.9 C) (Oral)  Resp 18  SpO2 98%   Physical Exam  Constitutional: He is oriented to person, place, and time. He appears well-developed and well-nourished. No distress.  HENT:  Head: Normocephalic and atraumatic.  Eyes: Conjunctivae and EOM are normal.  Neck: Neck supple. No tracheal deviation present.  Cardiovascular: Normal rate.   Pulmonary/Chest: Effort normal. No respiratory distress.  Musculoskeletal: Normal range of motion.  Neurological: He is alert and oriented to person, place, and time.  Skin: Skin is warm and dry.  Psychiatric: He has a normal mood and affect. His behavior is normal.  Nursing note and vitals reviewed.   ED Course  Procedures (including critical care time)  DIAGNOSTIC STUDIES: Oxygen Saturation is 98% on RA, normal by my interpretation.    COORDINATION OF CARE: 7:49 PM-Discussed treatment plan which includes UA with pt at bedside and pt agreed to plan.   Labs Review Labs Reviewed  URINALYSIS, ROUTINE W REFLEX  MICROSCOPIC (NOT AT [facility]) - Abnormal; Notable for the following:    Color, Urine ORANGE (*)    APPearance CLOUDY (*)    Hgb urine dipstick SMALL (*)    Bilirubin Urine SMALL (*)    Ketones, ur 15 (*)    Nitrite POSITIVE (*)    Leukocytes, UA SMALL (*)    All other components within normal limits  URINE MICROSCOPIC-ADD ON - Abnormal; Notable for the following:    Squamous Epithelial / LPF 0-5  (*)    Bacteria, UA MANY (*)    All other components within normal limits  CBG MONITORING, ED    Imaging Review No results found. I have personally reviewed and evaluated these lab results as part of my medical decision-making.   EKG Interpretation None     Patient recently treated at health department for UTI.  He reports his STD screening exams are negative. UA reveals continuing infection. Will cover with cipro. Urine culture requested. MDM   Final diagnoses:  None  Pt diagnosed with a UTI. Pt is afebrile, without tachycardia or hypotension, or other signs of serious infection.  Pt to be dc home with antibiotics and instructions to follow up with PCP if symptoms persist. Discussed return precautions. Pt appears safe for discharge.  I personally performed the services described in this documentation, which was scribed in my presence. The recorded information has been reviewed and is accurate.     Felicie Morn, NP 05/05/15 2230  Laurence Spates, MD 05/06/15 [phone]

## 2015-05-07 LAB — URINE CULTURE: CULTURE: NO GROWTH

## 2015-06-29 ENCOUNTER — Telehealth: Payer: Self-pay

## 2015-06-29 NOTE — Telephone Encounter (Signed)
Patient contacted regarding new intake appointment. Date and time given. Information given regarding documents needed to qualify for financial eligibility.  Tammy K King, RN  

## 2015-07-05 ENCOUNTER — Ambulatory Visit: Payer: Self-pay

## 2016-07-05 ENCOUNTER — Telehealth: Payer: Self-pay

## 2016-07-13 ENCOUNTER — Ambulatory Visit: Payer: Self-pay | Admitting: Infectious Diseases

## 2016-07-13 ENCOUNTER — Ambulatory Visit: Payer: Self-pay

## 2016-07-24 ENCOUNTER — Ambulatory Visit (INDEPENDENT_AMBULATORY_CARE_PROVIDER_SITE_OTHER): Payer: Self-pay | Admitting: Infectious Diseases

## 2016-07-24 ENCOUNTER — Ambulatory Visit: Payer: Self-pay

## 2016-07-24 ENCOUNTER — Other Ambulatory Visit: Payer: Self-pay | Admitting: Pharmacist

## 2016-07-24 ENCOUNTER — Encounter: Payer: Self-pay | Admitting: Infectious Diseases

## 2016-07-24 VITALS — Temp 98.6°F | Wt 163.0 lb

## 2016-07-24 DIAGNOSIS — Z5181 Encounter for therapeutic drug level monitoring: Secondary | ICD-10-CM

## 2016-07-24 DIAGNOSIS — Z21 Asymptomatic human immunodeficiency virus [HIV] infection status: Secondary | ICD-10-CM

## 2016-07-24 DIAGNOSIS — B20 Human immunodeficiency virus [HIV] disease: Secondary | ICD-10-CM

## 2016-07-24 DIAGNOSIS — Z8619 Personal history of other infectious and parasitic diseases: Secondary | ICD-10-CM | POA: Insufficient documentation

## 2016-07-24 DIAGNOSIS — R21 Rash and other nonspecific skin eruption: Secondary | ICD-10-CM

## 2016-07-24 HISTORY — DX: Asymptomatic human immunodeficiency virus (hiv) infection status: Z21

## 2016-07-24 HISTORY — DX: Human immunodeficiency virus (HIV) disease: B20

## 2016-07-24 LAB — CBC WITH DIFFERENTIAL/PLATELET
BASOS ABS: 0 {cells}/uL (ref 0–200)
Basophils Relative: 0 %
Eosinophils Absolute: 210 cells/uL (ref 15–500)
Eosinophils Relative: 6 %
HCT: 43 % (ref 38.5–50.0)
Hemoglobin: 14.5 g/dL (ref 13.2–17.1)
Lymphocytes Relative: 51 %
Lymphs Abs: 1785 cells/uL (ref 850–3900)
MCH: 31.3 pg (ref 27.0–33.0)
MCHC: 33.7 g/dL (ref 32.0–36.0)
MCV: 92.7 fL (ref 80.0–100.0)
MONOS PCT: 11 %
MPV: 9 fL (ref 7.5–12.5)
Monocytes Absolute: 385 cells/uL (ref 200–950)
NEUTROS ABS: 1120 {cells}/uL — AB (ref 1500–7800)
Neutrophils Relative %: 32 %
PLATELETS: 235 10*3/uL (ref 140–400)
RBC: 4.64 MIL/uL (ref 4.20–5.80)
RDW: 13.2 % (ref 11.0–15.0)
WBC: 3.5 10*3/uL — ABNORMAL LOW (ref 3.8–10.8)

## 2016-07-24 LAB — HEPATITIS B CORE ANTIBODY, TOTAL: Hep B Core Total Ab: NONREACTIVE

## 2016-07-24 LAB — COMPLETE METABOLIC PANEL WITH GFR
ALBUMIN: 4.1 g/dL (ref 3.6–5.1)
ALT: 25 U/L (ref 9–46)
AST: 19 U/L (ref 10–40)
Alkaline Phosphatase: 89 U/L (ref 40–115)
BILIRUBIN TOTAL: 0.3 mg/dL (ref 0.2–1.2)
BUN: 10 mg/dL (ref 7–25)
CO2: 26 mmol/L (ref 20–31)
Calcium: 9.1 mg/dL (ref 8.6–10.3)
Chloride: 104 mmol/L (ref 98–110)
Creat: 0.98 mg/dL (ref 0.60–1.35)
GFR, Est African American: >89 mL/min (ref >=60)
Glucose, Bld: 96 mg/dL (ref 65–99)
Potassium: 4.2 mmol/L (ref 3.5–5.3)
Sodium: 136 mmol/L (ref 135–146)
TOTAL PROTEIN: 7.2 g/dL (ref 6.1–8.1)

## 2016-07-24 LAB — URINALYSIS
Bilirubin Urine: NEGATIVE
GLUCOSE, UA: NEGATIVE
Ketones, ur: NEGATIVE
Leukocytes, UA: NEGATIVE
Nitrite: NEGATIVE
Protein, ur: NEGATIVE
SPECIFIC GRAVITY, URINE: 1.023 (ref 1.001–1.035)
pH: 6 (ref 5.0–8.0)

## 2016-07-24 LAB — LIPID PANEL
CHOL/HDL RATIO: 3.6 ratio (ref <5.0)
CHOLESTEROL: 154 mg/dL (ref <200)
HDL: 43 mg/dL (ref >40)
LDL Cholesterol: 92 mg/dL (ref <100)
Triglycerides: 95 mg/dL (ref <150)
VLDL: 19 mg/dL (ref <30)

## 2016-07-24 LAB — HEPATITIS C ANTIBODY: HCV Ab: NEGATIVE

## 2016-07-24 LAB — HEPATITIS B SURFACE ANTIBODY,QUALITATIVE: Hep B S Ab: POSITIVE — AB

## 2016-07-24 LAB — HEPATITIS B SURFACE ANTIGEN: HEP B S AG: NEGATIVE

## 2016-07-24 LAB — HEPATITIS A ANTIBODY, TOTAL: Hep A Total Ab: REACTIVE — AB

## 2016-07-24 MED ORDER — ELVITEG-COBIC-EMTRICIT-TENOFAF 150-150-200-10 MG PO TABS: 1.0000 | ORAL_TABLET | Freq: Every day | ORAL | 5 refills | Status: DC

## 2016-07-24 NOTE — Progress Notes (Signed)
HPI: Ronald Salas is a 31 y.o. male who presents to the [place] clinic this morning as a transfer and to initiate care with our NP, Colletta Maryland.   Allergies: Allergies  Allergen Reactions  . Codeine Other (See Comments)    Stomach pains  . Bee Venom Hives    Hives     Past Medical History: Past Medical History:  Diagnosis Date  . Ankle injury   . HIV (human immunodeficiency virus infection) ([place]) 07/24/2016   Dx 10/2012  . Inguinal hernia, unilateral     Social History: Social History   Social History  . Marital status: Single    Spouse name: N/A  . Number of children: N/A  . Years of education: N/A   Social History Main Topics  . Smoking status: Current Every Day Smoker    Types: Cigarettes, Cigars  . Smokeless tobacco: Never Used  . Alcohol use Yes  . Drug use: Yes    Types: Marijuana  . Sexual activity: Yes   Other Topics Concern  . None   Social History Narrative  . None    Current Regimen: Stribild  Labs: No results found for: HIV1RNAQUANT, HIV1RNAVL, CD4TABS, HEPBSAB, HEPBSAG, HCVAB  CrCl: CrCl cannot be calculated (Patient's most recent lab result is older than the maximum 21 days allowed.).  Lipids: No results found for: CHOL, TRIG, HDL, CHOLHDL, VLDL, LDLCALC  Assessment: Ronald Salas is here today to initiate care in our clinic for his HIV infection.  He is currently taking Strilbild but has been out of the medication for ~3 weeks.  No income, no insurance.  He met with Juliann Pulse today and applied for ADAP.  After discussion with Colletta Maryland, our ID NP, we will change him over to [facility] for better long term kidney and bone health with TAF vs TDF.  Counseled him on Genvoya and not to miss any doses.  I applied to [employer] for him and he was already approved. The medication will be shipped here.  I called to let him know.  When his medication is here, I will schedule a follow-up appointment with [place].  Plans: - Genvoya through [facility] - F/u with pharmacy~3-4 weeks  after receiving medication  Cassie L. Kuppelweiser, PharmD, [facility] 07/24/2016, 2:40 PM

## 2016-07-24 NOTE — Patient Instructions (Addendum)
[facility] as soon as we get the paperwork Cassie asked you to bring us to get you your medications while ADAP is pending.   You will start on a new medication called Genvoya - this is similar to your Stribild and will be taken at the same time a day with a small amount of food.   You will follow up with the pharmacy team 2-3 weeks to review your labs. You can also access them on MyChart.  Nice to meet you!

## 2016-07-24 NOTE — Assessment & Plan Note (Signed)
Appears to be fungal component. Encouraged use of cotton socks with frequent changing throughout the day to help with keeping feet dry during the day. If drying is a problem suggested he could also try Vaseline/thick creams. Over the counter antifungal cream consistently if itchy/red between toes.

## 2016-07-24 NOTE — Assessment & Plan Note (Addendum)
New patient here to establish for HIV care. Baseline labs today: viral load, CD4, CBC, CMP, RPR, hepatitis serologies, UA, lipid panel, Quantiferon, HLA, and GC/C swabs. I discussed with Verner Mould treatment options/side effects, benefits of treatment and long-term outcomes. He was counseled on routine HIV care including medication adherence, blood monitoring, necessary vaccines and follow up visits. Counseled regarding safe sex practices including: condom use, partner disclosure and limiting partners. He spent time talking with our pharmacist Cassie to help with [employer] process and new medication and understands to reach out to our clinic in the future with questions.   We decided to switch him to [facility] today due to the safety profile of TAF vs TDF. He will receive medications from [facility] while we await ADAP approval. He is not insured and [facility]/ADAP application pending. Will return to clinic in 2-3 weeks to review lab work with Pharmacy team and with me in 2 months.

## 2016-07-24 NOTE — Progress Notes (Signed)
Patient Active Problem List   Diagnosis Date Noted  . HIV (human immunodeficiency virus infection) ([place]) 07/24/2016  . Rash 07/24/2016    Patient's Medications  New Prescriptions   ELVITEGRAVIR-COBICISTAT-EMTRICITABINE-TENOFOVIR (GENVOYA) 150-150-200-10 MG TABS TABLET    Take 1 tablet by mouth daily with breakfast. Try to take at the same time each day with small amount of food  Previous Medications   CIPROFLOXACIN (CIPRO) 500 MG TABLET    Take 1 tablet (500 mg total) by mouth 2 (two) times daily.   DOXYCYCLINE (VIBRA-TABS) 100 MG TABLET    Take 1 tablet (100 mg total) by mouth 2 (two) times daily.   PHENAZOPYRIDINE (PYRIDIUM) 100 MG TABLET    Take 1 tablet (100 mg total) by mouth 3 (three) times daily with meals.  Modified Medications   No medications on file  Discontinued Medications   ELVITEGRAVIR-COBICISTAT-EMTRICITABINE-TENOFOVIR (STRIBILD) 150-150-200-300 MG TABS TABLET    Take 1 tablet by mouth daily with breakfast.    Subjective: Ronald Salas is here today for his first visit to establish HIV care at [facility]. 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   HIV = Previously he was in care at [facility] with Dr. Marya Amsler. Last OV was 08/2015. He was diagnosed originally in 10/2012. He has been taking Stribild every evening but has been out about 3 weeks now due to lapse in ADAP and clinic transfer. Reports that when he had his medication he rarely missed doses and if he did forget he would take it immediately. He would like to get a pill key chain to help with this. Since learning of his positive test he has chosen to keep his status private from his close friends and family. He is currently not in a relationship. He has engaged in sexual activity (mostly oral sex) over the last 6 months. Endorses male sexual partners only. When he is sexually active he discloses HIV status to partners and is using condoms every time.    Rash, Soles of Feet = reports he has had a peeling rash to the soles of both of his feet. Associated symptoms include itching and discoloration occasionally. He endorses sweaty feet and uses athletic socks. Has attempted OTC treatment with infrequent use of athletes foot cream. Concerned it is related to his Stribild. Uncertain as to when it started.   Health Maintenance = reports he is up to date with childhood vaccines and recently received boosters at a previous clinic visit. Does not have a primary care provider. Exercises occasionally. Eats a balanced diet. Reports occasional marijuana use. Denies smoking, excessive ETOH or other illicit drugs.   Review of Systems: Review of Systems  Constitutional: Negative for chills, fever, malaise/fatigue and weight loss.  HENT: Negative for sore throat.   Respiratory: Negative for cough, sputum production and shortness of breath.   Cardiovascular: Negative.   Gastrointestinal: Negative for abdominal pain, diarrhea and vomiting.  Musculoskeletal: Negative for joint pain, myalgias and neck pain.  Skin: Negative for rash.  Neurological: Negative for headaches.  Psychiatric/Behavioral: Negative for depression and substance abuse. The patient is not nervous/anxious.     Past Medical History:  Diagnosis Date  . Ankle injury   . HIV (human immunodeficiency virus infection) ([place]) 07/24/2016   Dx 10/2012  . Inguinal hernia, unilateral     Social History  Substance Use Topics  . Smoking status: Current Every Day Smoker    Types:  Cigarettes, Cigars  . Smokeless tobacco: Never Used  . Alcohol use Yes    Family History  Problem Relation Age of Onset  . Hypertension Mother   . Hypertension Father     Allergies  Allergen Reactions  . Codeine Other (See Comments)    Stomach pains  . Bee Venom Hives    Hives     Objective: CONSTITUTIONAL:in no apparent distress and well developed and well nourished  Vitals:   07/24/16 1009  Temp: 98.6 F  (37 C)   EYES: anicteric HENT: No thrush. Normal dentition.  CARD:Cor RRR and No murmurs RESP: clear throughout lung fields a/p, normal rate and effort  GY:BWLSL sounds are normal, liver is not enlarged, spleen is not enlarged HT:DSKAJGOTLX pulses normal, no pedal edema, no clubbing or cyanosis SKIN: warm and dry. Peeling rash to soles of feet noted. No erythema NEURO: alert and oriented x 3   Lab Results Lab Results  Component Value Date   WBC 4.8 03/24/2014   HGB 15.4 03/24/2014   HCT 44.1 03/24/2014   MCV 91.5 03/24/2014   PLT 260 03/24/2014    Lab Results  Component Value Date   CREATININE 0.91 03/24/2014   BUN 9 03/24/2014   NA 137 03/24/2014   K 4.1 03/24/2014   CL 101 03/24/2014   CO2 30 03/24/2014    Lab Results  Component Value Date   ALT 23 03/24/2014   AST 23 03/24/2014   ALKPHOS 65 03/24/2014   BILITOT 0.5 03/24/2014    No results found for: CHOL, HDL, LDLCALC, LDLDIRECT, TRIG, CHOLHDL No results found for: HIV1RNAQUANT, HIV1RNAVL, CD4TABS No results found for: HIV1GENOSEQ No results found for: HAV No results found for: HEPBSAG, HEPBSAB No results found for: HCVAB No results found for: CHLAMYDIAWP, N Lab Results  Component Value Date   GCPROBEAPT NEGATIVE 09/30/2013   No results found for: QUANTGOLD    Assessment and Plan:  Problem List Items Addressed This Visit      Musculoskeletal and Integument   Rash    Appears to be fungal component. Encouraged use of cotton socks with frequent changing throughout the day to help with keeping feet dry during the day. If drying is a problem suggested he could also try Vaseline/thick creams. Over the counter antifungal cream consistently if itchy/red between toes.         Other   HIV (human immunodeficiency virus infection) ([place]) (Chronic)    New patient here to establish for HIV care. Baseline labs today: viral load, CD4, CBC, CMP, RPR, hepatitis serologies, UA, lipid panel, Quantiferon, HLA, and GC/C  swabs. I discussed with Verner Mould treatment options/side effects, benefits of treatment and long-term outcomes. He was counseled on routine HIV care including medication adherence, blood monitoring, necessary vaccines and follow up visits. Counseled regarding safe sex practices including: condom use, partner disclosure and limiting partners. He spent time talking with our pharmacist Cassie to help with [employer] process and new medication and understands to reach out to our clinic in the future with questions.   We decided to switch him to [facility] today due to the safety profile of TAF vs TDF. He will receive medications from [facility] while we await ADAP approval. He is not insured and [facility]/ADAP application pending. Will return to clinic in 2-3 weeks to review lab work with Pharmacy team and with me in 2 months.          Relevant Orders   CBC with Differential/Platelet  COMPLETE METABOLIC PANEL WITH GFR   RPR   Hepatitis B surface antigen   Hepatitis B surface antibody   Hepatitis B core antibody, total   Hepatitis C antibody   Hepatitis A antibody, total   Urinalysis   Lipid panel   T-helper cells (CD4) count (not at [facility])   HIV-1 RNA ultraquant reflex to gentyp+   Cytology (oral, anal, urethral) ancillary only   Cytology (oral, anal, urethral) ancillary only   Quantiferon tb gold assay   HLA B*[id number]   Urine cytology ancillary only    Other Visit Diagnoses    Medication monitoring encounter    -  Primary   Relevant Orders   COMPLETE METABOLIC PANEL WITH GFR   Urinalysis   T-helper cells (CD4) count (not at [facility])   HIV-1 RNA ultraquant reflex to gentyp+      I spent 45 minutes with the patient today. Greater than 50% of the time spent face-to-face counseling, obtaining and reviewing records and coordination of care re: HIV and health maintenance.   Janene Madeira, MSN, NP-C [facility]  07/24/16 1:02  PM

## 2016-07-25 LAB — T-HELPER CELLS (CD4) COUNT (NOT AT ARMC)
CD4 T CELL ABS: 830 /uL (ref 400–2700)
CD4 T CELL HELPER: 42 % (ref 33–55)

## 2016-07-25 LAB — CYTOLOGY, (ORAL, ANAL, URETHRAL) ANCILLARY ONLY
CHLAMYDIA, DNA PROBE: NEGATIVE
Chlamydia: NEGATIVE
Neisseria Gonorrhea: NEGATIVE
Neisseria Gonorrhea: NEGATIVE

## 2016-07-25 LAB — RPR TITER: RPR Titer: 1:1 {titer}

## 2016-07-25 LAB — RPR: RPR Ser Ql: REACTIVE — AB

## 2016-07-26 LAB — QUANTIFERON TB GOLD ASSAY (BLOOD)
INTERFERON GAMMA RELEASE ASSAY: NEGATIVE
Mitogen-Nil: 8.4 IU/mL
QUANTIFERON NIL VALUE: 0.15 [IU]/mL
Quantiferon Tb Ag Minus Nil Value: <0 IU/mL

## 2016-07-26 LAB — HIV-1 RNA,QN PCR W/REFLEX GENOTYPE
HIV-1 RNA, QN PCR: 5070 {copies}/mL — AB
HIV-1 RNA, QN PCR: 3.71 Log cps/mL — ABNORMAL HIGH

## 2016-07-26 LAB — FLUORESCENT TREPONEMAL AB(FTA)-IGG-BLD: Fluorescent Treponemal ABS: REACTIVE — AB

## 2016-07-26 LAB — URINE CYTOLOGY ANCILLARY ONLY
Chlamydia: POSITIVE — AB
Neisseria Gonorrhea: NEGATIVE

## 2016-07-27 ENCOUNTER — Telehealth: Payer: Self-pay | Admitting: *Deleted

## 2016-07-27 NOTE — Telephone Encounter (Signed)
-----   Message from Blanchard KelchStephanie N Dixon, NP sent at 07/27/2016  1:25 PM EDT ----- Urine + for chlamydia - please tx with 1 gm azithromycin PO x 1 dose   RPR and Treponemal Ab are both reactive, titer 1:1 - please ask him if he has previously been treated / tested for syphilis. If he has never been tested will treat with 2.4 million units Bicillin IM weekly x 3 doses. If he has had negative test within the last 12 months treat with 2.4 million units Bicillin IM x 1 dose.   As expected his VL is elevated being off his medications x 3 weeks.

## 2016-07-27 NOTE — Progress Notes (Signed)
Urine + for chlamydia - please tx with 1 gm azithromycin PO x 1 dose   RPR and Treponemal Ab are both reactive, titer 1:1 - please ask him if he has previously been treated / tested for syphilis. If he has never been tested will treat with 2.4 million units Bicillin IM weekly x 3 doses. If he has had negative test within the last 12 months treat with 2.4 million units Bicillin IM x 1 dose.   As expected his VL is elevated being off his medications x 3 weeks.

## 2016-07-27 NOTE — Telephone Encounter (Signed)
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  He states he cannot remember his last test date or result, thinks he was told he may always test positive with a low titer. He will come Monday afternoon to pick up medications that were mailed here, receive 1 gm azithromycin oral once, and for potential bicillin injection. Please advise if he will need the bicillin injection and how many. Andree CossHowell, Kaylynne Andres M, RN

## 2016-07-27 NOTE — Telephone Encounter (Signed)
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 Will d/w Dr. Ninetta LightsHatcher Monday to see how we should proceed. Thank you.

## 2016-07-30 ENCOUNTER — Ambulatory Visit: Payer: Self-pay

## 2016-07-30 ENCOUNTER — Telehealth: Payer: Self-pay | Admitting: Pharmacist

## 2016-07-30 ENCOUNTER — Other Ambulatory Visit: Payer: Self-pay | Admitting: Infectious Diseases

## 2016-07-30 DIAGNOSIS — A549 Gonococcal infection, unspecified: Secondary | ICD-10-CM

## 2016-07-30 DIAGNOSIS — B20 Human immunodeficiency virus [HIV] disease: Secondary | ICD-10-CM

## 2016-07-30 LAB — HLA B*5701: HLA-B*5701 w/rflx HLA-B High: NEGATIVE

## 2016-07-30 MED ORDER — AZITHROMYCIN 250 MG PO TABS
1000.0000 mg | ORAL_TABLET | Freq: Once | ORAL | Status: AC
  Administered 2016-07-30: 1000 mg via ORAL

## 2016-07-30 NOTE — Telephone Encounter (Signed)
Perfect, thank you

## 2016-07-30 NOTE — Telephone Encounter (Signed)
With RPR 1:1 will defer treatment at this time. Proceed with azithromycin tx. Will FU with RPR titer with future visits.   Rexene AlbertsStephanie Dixon, NP

## 2016-07-30 NOTE — Telephone Encounter (Signed)
Wolf picked up his medications today from our clinic.  Called him and made a f/u appointment with us for July 24th at 10:30am.

## 2016-08-02 LAB — HIV-1 GENOTYPR PLUS

## 2016-08-07 ENCOUNTER — Encounter: Payer: Self-pay | Admitting: Licensed Clinical Social Worker

## 2016-08-28 ENCOUNTER — Ambulatory Visit: Payer: Self-pay

## 2016-09-12 ENCOUNTER — Ambulatory Visit (INDEPENDENT_AMBULATORY_CARE_PROVIDER_SITE_OTHER): Payer: Self-pay | Admitting: Pharmacist

## 2016-09-12 ENCOUNTER — Encounter: Payer: Self-pay | Admitting: Infectious Diseases

## 2016-09-12 DIAGNOSIS — B20 Human immunodeficiency virus [HIV] disease: Secondary | ICD-10-CM

## 2016-09-12 MED ORDER — ELVITEG-COBIC-EMTRICIT-TENOFAF 150-150-200-10 MG PO TABS
1.0000 | ORAL_TABLET | Freq: Every day | ORAL | 5 refills | Status: DC
Start: 2016-09-12 — End: 2017-06-26

## 2016-09-12 NOTE — Progress Notes (Signed)
HPI: Merlene PullingJawan Boomershine is a 31 y.o. male who presents to the [facility] pharmacy for HIV follow-up.   Allergies: Allergies  Allergen Reactions  . Codeine Other (See Comments)    Stomach pains  . Bee Venom Hives    Hives     Past Medical History: Past Medical History:  Diagnosis Date  . Ankle injury   . HIV (human immunodeficiency virus infection) ([facility]) 07/24/2016   Dx 10/2012  . Inguinal hernia, unilateral     Social History: Social History   Social History  . Marital status: Single    Spouse name: N/A  . Number of children: N/A  . Years of education: N/A   Social History Main Topics  . Smoking status: Current Every Day Smoker    Types: Cigarettes, Cigars  . Smokeless tobacco: Never Used  . Alcohol use Yes  . Drug use: Yes    Types: Marijuana  . Sexual activity: Yes   Other Topics Concern  . Not on file   Social History Narrative  . No narrative on file    Current Regimen: Genvoya  Labs: CD4 T Cell Abs (/uL)  Date Value  07/24/2016 830   Hep B S Ab (no units)  Date Value  07/24/2016 POS (A)   Hepatitis B Surface Ag (no units)  Date Value  07/24/2016 NEGATIVE   HCV Ab (no units)  Date Value  07/24/2016 NEGATIVE    CrCl: CrCl cannot be calculated (Patient's most recent lab result is older than the maximum 21 days allowed.).  Lipids:    Component Value Date/Time   CHOL 154 07/24/2016 1101   TRIG 95 07/24/2016 1101   HDL 43 07/24/2016 1101   CHOLHDL 3.6 07/24/2016 1101   VLDL 19 07/24/2016 1101   LDLCALC 92 07/24/2016 1101    Assessment: Tora DuckJawan is here today for HIV follow-up.  He was recently restarted on HIV medications after being out for ~3 weeks.  We restarted him on Genvoya.  He is not having any issues with taking the Genvoya - no side effects.  When I asked him if he had missed any doses, he replied with "yes, I've been out for 4-5 days, so I need a refill".  I asked him why he didn't pick it up from the pharmacy and he said he wasn't sure how  to do that (?). Told him [facility] [address].  Told him not to miss a dose.  He was surprised when I told him his viral load was a little over 5,000 when we checked it back in June. I told him that will happen if he doesn't take his medications.    He also complained of a rash on his groin.  He states he thinks it is due to the bath and body works lotion he is using.  He states it is getting bigger and itching like crazy. I had Minh take a look at it and he thought it was contact dermatitis.  He is currently using hydrocortisone cream.  Minh told him to continue using that and to get some antifungal cream.  Encouraged him to come back in 2 weeks if the rash is not better.  I will get a HIV viral load today and schedule him with Judeth CornfieldStephanie.   Plans: - Continue Genvoya - Stop missing doses - HIV RNA today - F/u with Judeth CornfieldStephanie 10/16 at 945am  Kaylaann Mountz L. Lanea Vankirk, PharmD, CPP Infectious Diseases Clinical Pharmacist [facility] 09/12/2016, 11:49 AM

## 2016-09-13 ENCOUNTER — Encounter: Payer: Self-pay | Admitting: Infectious Diseases

## 2016-09-15 LAB — HIV-1 RNA QUANT-NO REFLEX-BLD
HIV 1 RNA QUANT: NOT DETECTED {copies}/mL
HIV-1 RNA Quant, Log: <1.3 Log copies/mL

## 2016-09-19 ENCOUNTER — Emergency Department (HOSPITAL_COMMUNITY)
Admission: EM | Admit: 2016-09-19 | Discharge: 2016-09-19 | Disposition: A | Discharge status: Home / Self Care | Payer: Self-pay | Attending: Emergency Medicine | Admitting: Emergency Medicine

## 2016-09-19 ENCOUNTER — Encounter (HOSPITAL_COMMUNITY): Payer: Self-pay | Admitting: Emergency Medicine

## 2016-09-19 DIAGNOSIS — Z113 Encounter for screening for infections with a predominantly sexual mode of transmission: Secondary | ICD-10-CM

## 2016-09-19 DIAGNOSIS — B20 Human immunodeficiency virus [HIV] disease: Secondary | ICD-10-CM | POA: Insufficient documentation

## 2016-09-19 DIAGNOSIS — F17210 Nicotine dependence, cigarettes, uncomplicated: Secondary | ICD-10-CM | POA: Insufficient documentation

## 2016-09-19 DIAGNOSIS — Z202 Contact with and (suspected) exposure to infections with a predominantly sexual mode of transmission: Secondary | ICD-10-CM | POA: Insufficient documentation

## 2016-09-19 DIAGNOSIS — R350 Frequency of micturition: Secondary | ICD-10-CM

## 2016-09-19 LAB — URINALYSIS, ROUTINE W REFLEX MICROSCOPIC
Bacteria, UA: NONE SEEN
Bilirubin Urine: NEGATIVE
Glucose, UA: NEGATIVE mg/dL
KETONES UR: NEGATIVE mg/dL
Leukocytes, UA: NEGATIVE
Nitrite: NEGATIVE
Protein, ur: NEGATIVE mg/dL
SPECIFIC GRAVITY, URINE: 1.026 (ref 1.005–1.030)
pH: 5 (ref 5.0–8.0)

## 2016-09-19 LAB — CBG MONITORING, ED: GLUCOSE-CAPILLARY: 88 mg/dL (ref 65–99)

## 2016-09-19 MED ORDER — AZITHROMYCIN 250 MG PO TABS
1000.0000 mg | ORAL_TABLET | Freq: Once | ORAL | Status: AC
  Administered 2016-09-19: 1000 mg via ORAL
  Filled 2016-09-19: qty 4

## 2016-09-19 MED ORDER — LIDOCAINE HCL (PF) 1 % IJ SOLN
INTRAMUSCULAR | Status: AC
  Administered 2016-09-19: 0.9 mL
  Filled 2016-09-19: qty 5

## 2016-09-19 MED ORDER — CEFTRIAXONE SODIUM 250 MG IJ SOLR
250.0000 mg | Freq: Once | INTRAMUSCULAR | Status: AC
  Administered 2016-09-19: 250 mg via INTRAMUSCULAR
  Filled 2016-09-19: qty 250

## 2016-09-19 MED ORDER — CEFTRIAXONE SODIUM 250 MG IJ SOLR
1000.0000 mg | Freq: Once | INTRAMUSCULAR | Status: DC
  Filled 2016-09-19: qty 1000

## 2016-09-19 NOTE — ED Provider Notes (Signed)
[facility]-EMERGENCY DEPT Provider Note   CSN: [id number] Arrival date & time: 09/19/16  1821  By signing my name below, I, Rosana Fret, attest that this documentation has been prepared under the direction and in the presence of non-physician practitioner, Leary Roca, PA-C. Electronically Signed: Rosana Fret, ED Scribe. 09/19/16. 8:22 PM.  History   Chief Complaint Chief Complaint  Patient presents with  . Urinary Frequency   The history is provided by the patient. No language interpreter was used.   HPI Comments: Ronald Salas is a 31 y.o. male with a PMHx of HIV and inguinal hernia (23 years ago, reparied), who presents to the Emergency Department complaining of moderate urinary frequency onset 2 days ago. Pt states he has been urinated 10 times a day when his normal amount is 5.  He states it is not painful and has no painful urination. Pt is sexually active and uses protection regularly. Last encounter was 1 month ago. Pt has a hx of syphilis 6 years ago and was treated accordingly. No hx of DM. Denies polydipsia and polyphagia. Denies fever, chills, hematuria, dysuria, urgency, penile discharge, testicular pain, painful BM, trauma, abdominal pain, rash, lesions or any other complaints at this time. Patient requesting to be tested for STDs and is concerned at this time.  Past Medical History:  Diagnosis Date  . Ankle injury   . HIV (human immunodeficiency virus infection) (HCC) 07/24/2016   Dx 10/2012  . Inguinal hernia, unilateral     Patient Active Problem List   Diagnosis Date Noted  . HIV (human immunodeficiency virus infection) (HCC) 07/24/2016  . Rash 07/24/2016    Past Surgical History:  Procedure Laterality Date  . INGUINAL HERNIA REPAIR         Home Medications    Prior to Admission medications   Medication Sig Start Date End Date Taking? Authorizing Provider  ciprofloxacin (CIPRO) 500 MG tablet Take 1 tablet (500 mg total) by mouth 2 (two) times  daily. Patient not taking: Reported on 07/24/2016 05/05/15   Felicie Morn, NP  doxycycline (VIBRA-TABS) 100 MG tablet Take 1 tablet (100 mg total) by mouth 2 (two) times daily. Patient not taking: Reported on 07/24/2016 03/24/14   Earley Favor, NP  elvitegravir-cobicistat-emtricitabine-tenofovir (GENVOYA) 150-150-200-10 MG TABS tablet Take 1 tablet by mouth daily with breakfast. Try to take at the same time each day with small amount of food 09/12/16   Kuppelweiser, Cassie L, RPH  phenazopyridine (PYRIDIUM) 100 MG tablet Take 1 tablet (100 mg total) by mouth 3 (three) times daily with meals. Patient not taking: Reported on 07/24/2016 03/24/14   Earley Favor, NP    Family History Family History  Problem Relation Age of Onset  . Hypertension Mother   . Hypertension Father     Social History Social History  Substance Use Topics  . Smoking status: Current Some Day Smoker  . Smokeless tobacco: Never Used  . Alcohol use No     Allergies   Codeine and Bee venom   Review of Systems Review of Systems  Constitutional: Negative for fever.  Gastrointestinal: Negative for abdominal pain, nausea and vomiting.  Endocrine: Negative for polydipsia.  Genitourinary: Positive for frequency. Negative for discharge, genital sores, penile pain and testicular pain.  Skin: Negative for rash.  All other systems reviewed and are negative.    Physical Exam Updated Vital Signs BP 122/67 (BP Location: Left Arm)   Pulse (!) 52   Temp 98.6 F (37 C) (Oral)   Resp 18  Ht 6' (1.829 m)   Wt 77.1 kg (170 lb)   SpO2 100%   BMI 23.06 kg/m   Physical Exam  Constitutional: He is oriented to person, place, and time. He appears well-developed and well-nourished.  Non-toxic appearing  HENT:  Head: Normocephalic and atraumatic.  Right Ear: External ear normal.  Left Ear: External ear normal.  Nose: Nose normal.  Mouth/Throat: Oropharynx is clear and moist. No oropharyngeal exudate.  Eyes: EOM are normal.  Right eye exhibits no discharge. Left eye exhibits no discharge. No scleral icterus.  Neck: Normal range of motion. Neck supple.  Cardiovascular: Normal rate, regular rhythm and normal heart sounds.  Exam reveals no gallop and no friction rub.   No murmur heard. Pulmonary/Chest: Effort normal and breath sounds normal. No respiratory distress. He has no wheezes. He has no rales.  Abdominal: Soft. He exhibits no distension. There is no tenderness. There is no rigidity, no rebound, no guarding and no CVA tenderness. Hernia confirmed negative in the right inguinal area and confirmed negative in the left inguinal area.  Genitourinary: Testes normal and penis normal. Cremasteric reflex is present. Right testis shows no mass, no swelling and no tenderness. Left testis shows no mass, no swelling and no tenderness. Circumcised. No phimosis, penile erythema or penile tenderness. No discharge found.  Genitourinary Comments: No lesions or rash.   Musculoskeletal: Normal range of motion.  Lymphadenopathy:    He has no cervical adenopathy. No inguinal adenopathy noted on the right or left side.  Neurological: He is alert and oriented to person, place, and time.  Skin: Skin is warm and dry.  Psychiatric: He has a normal mood and affect. Judgment normal.  Nursing note and vitals reviewed.     ED Treatments / Results  DIAGNOSTIC STUDIES: Oxygen Saturation is 100% on RA, normal by my interpretation.   COORDINATION OF CARE: 8:18 PM-Discussed next steps with pt including an exam and GC/chlamydia test. Pt verbalized understanding and is agreeable with the plan.   Labs (all labs ordered are listed, but only abnormal results are displayed) Labs Reviewed  URINALYSIS, ROUTINE W REFLEX MICROSCOPIC - Abnormal; Notable for the following:       Result Value   Hgb urine dipstick MODERATE (*)    Squamous Epithelial / LPF 0-5 (*)    All other components within normal limits  CBG MONITORING, ED  GC/CHLAMYDIA  PROBE AMP ([place]) [facility]    EKG  EKG Interpretation None       Radiology No results found.  Procedures Procedures (including critical care time)  Medications Ordered in ED Medications  azithromycin (ZITHROMAX) tablet 1,000 mg (1,000 mg Oral Given 09/19/16 2055)  cefTRIAXone (ROCEPHIN) injection 250 mg (250 mg Intramuscular Given 09/19/16 2056)  lidocaine (PF) (XYLOCAINE) 1 % injection (0.9 mLs  Given 09/19/16 2058)     Initial Impression / Assessment and Plan / ED Course  I have reviewed the triage vital signs and the nursing notes.  Pertinent labs & imaging results that were available during my care of the patient were reviewed by me and considered in my medical decision making (see chart for details).     31 year old male with increased urinary frequency and concern for STDs. Vital signs reassuring. Patient is nontoxic-appearing. Exam reassuring as above. No tenderness to palpation of the testes or epididymis to suggest orchitis or epididymitis. No abdominal pain. UA without evidence of UTI. CBC without evidence of new onset diabetes. Patient has history of HIV  is followed by infectious disease doctors. He is denying blood lab testing for HIV or syphilis. STD cultures obtained including gonorrhea and chlamydia. Patient elected to be treated for prophylactic ally in the department.Patient to be discharged with instructions to follow up with infectious disease doctor. Discussed importance of using protection when sexually active orremaining abstinent. Pt understands that they have GC/Chlamydia cultures pending and that they will need to inform all sexual partners if results return positive. I advised the patient to return to the emergency department with new or worsening symptoms or new concerns. Specific return precautions discussed. The patient verbalized understanding and agreement with plan. All questions answered. No further questions at this time. The patient appears safe  for discharge.  Final Clinical Impressions(s) / ED Diagnoses   Final diagnoses:  Screening for STD (sexually transmitted disease)  Urinary frequency    New Prescriptions Discharge Medication List as of 09/19/2016  8:57 PM     I personally performed the services described in this documentation, which was scribed in my presence. The recorded information has been reviewed and is accurate.       Princella Pellegrini 09/19/16 2228    Donnetta Hutching, MD 09/22/16 [phone]

## 2016-09-19 NOTE — ED Triage Notes (Signed)
Pt presents with urinary frequency since Monday; pt denies discharge or painful urination however wants to get checked for STIs and UTIs; pt denies fever, abd pain, and or foul smelling urine

## 2016-09-19 NOTE — Discharge Instructions (Signed)
Your urine did not show evidence of infection.   Today you were cultured for a sexually transmitted disease like chlamydia or gonorrhea. You have elected to be treated at this time as a precaution. You will receive a call in 48 hours if the test is positive. Please refrain from sexual activity for 48 hours. If the test is positive, please refrain from sexual activity for 10 days for the medicine to take effect and notify your sexual partners for the last 6 months as they, too, may want to be tested.   Please follow up with your infectious disease doctor in regards to your past syphilis and HIV.   If you should develop severe or worsening pain in your abdomen or the pelvis or develop severe fevers,nausea or vomiting that prevent you from taking your medications, return to the emergency department immediately. If you develop worsening or new concerning symptoms you can return to the emergency department for re-evaluation.

## 2016-09-25 NOTE — Telephone Encounter (Signed)
Tammy K King, RN 

## 2016-11-20 ENCOUNTER — Ambulatory Visit: Payer: Self-pay | Admitting: Infectious Diseases

## 2016-12-24 NOTE — Progress Notes (Unsigned)
Treated with Azithromycin 1000 mgs by mouth .

## 2017-03-07 ENCOUNTER — Emergency Department (HOSPITAL_COMMUNITY)
Admission: EM | Admit: 2017-03-07 | Discharge: 2017-03-07 | Disposition: A | Discharge status: Home / Self Care | Payer: Self-pay | Attending: Emergency Medicine | Admitting: Emergency Medicine

## 2017-03-07 ENCOUNTER — Encounter (HOSPITAL_COMMUNITY): Payer: Self-pay | Admitting: Emergency Medicine

## 2017-03-07 ENCOUNTER — Other Ambulatory Visit: Payer: Self-pay

## 2017-03-07 DIAGNOSIS — L309 Dermatitis, unspecified: Secondary | ICD-10-CM | POA: Insufficient documentation

## 2017-03-07 DIAGNOSIS — F17200 Nicotine dependence, unspecified, uncomplicated: Secondary | ICD-10-CM | POA: Insufficient documentation

## 2017-03-07 DIAGNOSIS — N342 Other urethritis: Secondary | ICD-10-CM | POA: Insufficient documentation

## 2017-03-07 DIAGNOSIS — Z79899 Other long term (current) drug therapy: Secondary | ICD-10-CM | POA: Insufficient documentation

## 2017-03-07 LAB — URINALYSIS, ROUTINE W REFLEX MICROSCOPIC
BILIRUBIN URINE: NEGATIVE
Glucose, UA: NEGATIVE mg/dL
KETONES UR: NEGATIVE mg/dL
NITRITE: NEGATIVE
PROTEIN: NEGATIVE mg/dL
Specific Gravity, Urine: 1.025 (ref 1.005–1.030)
pH: 5 (ref 5.0–8.0)

## 2017-03-07 MED ORDER — CEFTRIAXONE SODIUM 250 MG IJ SOLR
250.0000 mg | Freq: Once | INTRAMUSCULAR | Status: AC
  Administered 2017-03-07: 250 mg via INTRAMUSCULAR
  Filled 2017-03-07: qty 250

## 2017-03-07 MED ORDER — LIDOCAINE HCL (PF) 1 % IJ SOLN
INTRAMUSCULAR | Status: AC
  Filled 2017-03-07: qty 5

## 2017-03-07 MED ORDER — AZITHROMYCIN 250 MG PO TABS
1000.0000 mg | ORAL_TABLET | Freq: Once | ORAL | Status: AC
  Administered 2017-03-07: 1000 mg via ORAL
  Filled 2017-03-07: qty 4

## 2017-03-07 MED ORDER — LIDOCAINE HCL (PF) 1 % IJ SOLN
2.0000 mL | Freq: Once | INTRAMUSCULAR | Status: AC
Start: 2017-03-07 — End: 2017-03-07
  Administered 2017-03-07: 2 mL

## 2017-03-07 NOTE — Discharge Instructions (Signed)
Please read attached information. If you experience any new or worsening signs or symptoms please return to the emergency room for evaluation. Please follow-up with your primary care provider or specialist as discussed. Please use medication prescribed only as directed and discontinue taking if you have any concerning signs or symptoms.   °

## 2017-03-07 NOTE — ED Provider Notes (Signed)
[facility] EMERGENCY DEPARTMENT Provider Note   CSN: [id number] Arrival date & time: 03/07/17  1107     History   Chief Complaint Chief Complaint  Patient presents with  . Urinary Frequency    HPI Ronald Salas is a 32 y.o. male.  HPI   32 year old male presents today with reports of urinary frequency.  He notes a 2-day history of this, purulent penile discharge this morning.  Patient denies any penile pain, testicular pain.  Patient reports he is sexually active with men, no recent inserted intercourse but rather oral intercourse.  Patient also notes he has had a rash in his groin for 1 year not resolved with hydrocortisone cream he notes it is itchy and dry.  Past Medical History:  Diagnosis Date  . Ankle injury   . HIV (human immunodeficiency virus infection) (HCC) 07/24/2016   Dx 10/2012  . Inguinal hernia, unilateral     Patient Active Problem List   Diagnosis Date Noted  . HIV (human immunodeficiency virus infection) (HCC) 07/24/2016  . Rash 07/24/2016    Past Surgical History:  Procedure Laterality Date  . INGUINAL HERNIA REPAIR         Home Medications    Prior to Admission medications   Medication Sig Start Date End Date Taking? Authorizing Provider  ciprofloxacin (CIPRO) 500 MG tablet Take 1 tablet (500 mg total) by mouth 2 (two) times daily. Patient not taking: Reported on 07/24/2016 05/05/15   Felicie Morn, NP  doxycycline (VIBRA-TABS) 100 MG tablet Take 1 tablet (100 mg total) by mouth 2 (two) times daily. Patient not taking: Reported on 07/24/2016 03/24/14   Earley Favor, NP  elvitegravir-cobicistat-emtricitabine-tenofovir (GENVOYA) 150-150-200-10 MG TABS tablet Take 1 tablet by mouth daily with breakfast. Try to take at the same time each day with small amount of food 09/12/16   Kuppelweiser, Cassie L, RPH-CPP  phenazopyridine (PYRIDIUM) 100 MG tablet Take 1 tablet (100 mg total) by mouth 3 (three) times daily with meals. Patient not  taking: Reported on 07/24/2016 03/24/14   Earley Favor, NP    Family History Family History  Problem Relation Age of Onset  . Hypertension Mother   . Hypertension Father     Social History Social History   Tobacco Use  . Smoking status: Current Some Day Smoker  . Smokeless tobacco: Never Used  Substance Use Topics  . Alcohol use: No  . Drug use: Yes    Types: Marijuana     Allergies   Codeine and Bee venom   Review of Systems Review of Systems  All other systems reviewed and are negative.    Physical Exam Updated Vital Signs BP 114/78 (BP Location: Right Arm)   Pulse 66   Temp 98.5 F (36.9 C) (Oral)   Ht 6' (1.829 m)   Wt 74.8 kg (165 lb)   SpO2 98%   BMI 22.38 kg/m   Physical Exam  Constitutional: He is oriented to person, place, and time. He appears well-developed and well-nourished.  HENT:  Head: Normocephalic and atraumatic.  Eyes: Conjunctivae are normal. Pupils are equal, round, and reactive to light. Right eye exhibits no discharge. Left eye exhibits no discharge. No scleral icterus.  Neck: Normal range of motion. No JVD present. No tracheal deviation present.  Pulmonary/Chest: Effort normal. No stridor.  Genitourinary:  Genitourinary Comments: Circumcised penis with penile discharge-groin with dry rash  Neurological: He is alert and oriented to person, place, and time. Coordination normal.  Psychiatric: He has a  normal mood and affect. His behavior is normal. Judgment and thought content normal.  Nursing note and vitals reviewed.    ED Treatments / Results  Labs (all labs ordered are listed, but only abnormal results are displayed) Labs Reviewed  URINALYSIS, ROUTINE W REFLEX MICROSCOPIC - Abnormal; Notable for the following components:      Result Value   Hgb urine dipstick MODERATE (*)    Leukocytes, UA TRACE (*)    Bacteria, UA RARE (*)    Squamous Epithelial / LPF 0-5 (*)    All other components within normal limits  GC/CHLAMYDIA PROBE  AMP ([place]    EKG  EKG Interpretation None       Radiology No results found.  Procedures Procedures (including critical care time)  Medications Ordered in ED Medications  cefTRIAXone (ROCEPHIN) injection 250 mg (not administered)  azithromycin (ZITHROMAX) tablet 1,000 mg (not administered)  lidocaine (PF) (XYLOCAINE) 1 % injection 2 mL (not administered)     Initial Impression / Assessment and Plan / ED Course  I have reviewed the triage vital signs and the nursing notes.  Pertinent labs & imaging results that were available during my care of the patient were reviewed by me and considered in my medical decision making (see chart for details).     Final Clinical Impressions(s) / ED Diagnoses   Final diagnoses:  Urethritis  Eczema, unspecified type    32 year old male presents today with urinary frequency and penile discharge consistent with STD.  He will be prophylactically treated here.  No systemic signs or symptoms.  Has what appears to be eczema in his groin, encouraged to help with dermatology or primary care, avoid hot showers, hydrocortisone cream as needed.  Patient encouraged to follow-up with health department if symptoms persist return for any worsening symptoms.  ED Discharge Orders    None       Rosalio LoudHedges, Marlaina Coburn, PA-C 03/07/17 1319    Eudelia Bunchardama, Amadeo GarnetPedro Eduardo, MD 03/07/17 2045

## 2017-03-07 NOTE — ED Triage Notes (Signed)
Patient presents from home with complaints of Urinary Frequency x2days. Denies any burning, flank pain, urgency, any penile discharge urine discoloration. . Patient also reports rash on bilateral  in inner thighs. Patient denies a fever, chest pain, shortness of breathe.

## 2017-03-07 NOTE — ED Notes (Signed)
Pt at nursing station to get D/C/ papers, pt verbalized understanding of D/C papers.

## 2017-03-08 LAB — GC/CHLAMYDIA PROBE AMP (~~LOC~~) NOT AT ARMC
Chlamydia: POSITIVE — AB
Neisseria Gonorrhea: NEGATIVE

## 2017-04-11 ENCOUNTER — Other Ambulatory Visit: Payer: Self-pay

## 2017-04-11 DIAGNOSIS — B20 Human immunodeficiency virus [HIV] disease: Secondary | ICD-10-CM

## 2017-04-12 ENCOUNTER — Encounter: Payer: Self-pay | Admitting: Family

## 2017-04-12 LAB — T-HELPER CELL (CD4) - (RCID CLINIC ONLY)
CD4 T CELL ABS: 940 /uL (ref 400–2700)
CD4 T CELL HELPER: 41 % (ref 33–55)

## 2017-04-13 LAB — HIV-1 RNA QUANT-NO REFLEX-BLD
HIV 1 RNA QUANT: NOT DETECTED {copies}/mL
HIV-1 RNA Quant, Log: <1.3 Log copies/mL

## 2017-04-25 ENCOUNTER — Ambulatory Visit: Payer: Self-pay | Admitting: Family

## 2017-04-25 NOTE — Progress Notes (Deleted)
Subjective:    Patient ID: Ronald Salas, male    DOB: [birth year], 32 y.o.   MRN: [id number]  No chief complaint on file.    HPI:  Ronald Salas is a 32 y.o. male who presents today for follow up if his HIV disease.   His last office visit was 07/24/16 when [facility] improved side effects from tenofovir. Since his last visit he has been to the ED twice with urinary frequency and urethritis in which he was treated for STI. He is currently prescribed Genvoya and has been previously prescribed Stribild, Truvada and Dolutegravir. His last Genotype in June of 2018 showed wild type virus. Most recently completed CD4 count from 04/11/17 was 940 with a viral load that is undetectable. Review of his immunizations indicate that he is due for meningococcal vaccinations, influenza, and pneumococcal.        Allergies  Allergen Reactions  . Codeine Other (See Comments)    Stomach pains  . Bee Venom Hives    Hives       Outpatient Medications Prior to Visit  Medication Sig Dispense Refill  . ciprofloxacin (CIPRO) 500 MG tablet Take 1 tablet (500 mg total) by mouth 2 (two) times daily. (Patient not taking: Reported on 07/24/2016) 14 tablet 0  . doxycycline (VIBRA-TABS) 100 MG tablet Take 1 tablet (100 mg total) by mouth 2 (two) times daily. (Patient not taking: Reported on 07/24/2016) 13 tablet 0  . elvitegravir-cobicistat-emtricitabine-tenofovir (GENVOYA) 150-150-200-10 MG TABS tablet Take 1 tablet by mouth daily with breakfast. Try to take at the same time each day with small amount of food 30 tablet 5  . phenazopyridine (PYRIDIUM) 100 MG tablet Take 1 tablet (100 mg total) by mouth 3 (three) times daily with meals. (Patient not taking: Reported on 07/24/2016) 10 tablet 0   No facility-administered medications prior to visit.      Past Medical History:  Diagnosis Date  . Ankle injury   . HIV (human immunodeficiency virus infection) (HCC)  07/24/2016   Dx 10/2012  . Inguinal hernia, unilateral      Past Surgical History:  Procedure Laterality Date  . INGUINAL HERNIA REPAIR         Review of Systems  Constitutional: Negative for activity change, appetite change, diaphoresis, fatigue, fever and unexpected weight change.  HENT: Negative for congestion, sinus pressure and sore throat.   Respiratory: Negative for cough, chest tightness, shortness of breath and wheezing.   Cardiovascular: Negative for chest pain and leg swelling.  Gastrointestinal: Negative for abdominal pain, constipation, diarrhea, nausea and vomiting.  Genitourinary: Negative for dysuria, flank pain, frequency, genital sores, hematuria and urgency.  Neurological: Negative for weakness and headaches.      Objective:    There were no vitals taken for this visit. Nursing note and vital signs reviewed.  Physical Exam  Constitutional: He is oriented to person, place, and time. He appears well-developed and well-nourished. No distress.  Cardiovascular: Normal rate, regular rhythm, normal heart sounds and intact distal pulses.  Pulmonary/Chest: Effort normal and breath sounds normal.  Neurological: He is alert and oriented to person, place, and time.  Skin: Skin is warm and dry.  Psychiatric: He has a normal mood and affect. His behavior is normal. Judgment and thought content normal.       Assessment & Plan:   Problem List Items Addressed This Visit    None       I am having  Ronald Pulling maintain his doxycycline, phenazopyridine, ciprofloxacin, and elvitegravir-cobicistat-emtricitabine-tenofovir.   No orders of the defined types were placed in this encounter.    Follow-up: No follow-ups on file.   Marcos Eke, MSN, [facility]

## 2017-05-04 ENCOUNTER — Emergency Department (HOSPITAL_COMMUNITY)
Admission: EM | Admit: 2017-05-04 | Discharge: 2017-05-04 | Disposition: A | Discharge status: Home / Self Care | Payer: Self-pay | Attending: Emergency Medicine | Admitting: Emergency Medicine

## 2017-05-04 ENCOUNTER — Other Ambulatory Visit: Payer: Self-pay

## 2017-05-04 DIAGNOSIS — F17200 Nicotine dependence, unspecified, uncomplicated: Secondary | ICD-10-CM | POA: Insufficient documentation

## 2017-05-04 DIAGNOSIS — R21 Rash and other nonspecific skin eruption: Secondary | ICD-10-CM | POA: Insufficient documentation

## 2017-05-04 DIAGNOSIS — R238 Other skin changes: Secondary | ICD-10-CM

## 2017-05-04 DIAGNOSIS — Z79899 Other long term (current) drug therapy: Secondary | ICD-10-CM | POA: Insufficient documentation

## 2017-05-04 DIAGNOSIS — Z202 Contact with and (suspected) exposure to infections with a predominantly sexual mode of transmission: Secondary | ICD-10-CM | POA: Insufficient documentation

## 2017-05-04 DIAGNOSIS — B20 Human immunodeficiency virus [HIV] disease: Secondary | ICD-10-CM | POA: Insufficient documentation

## 2017-05-04 MED ORDER — CLOTRIMAZOLE-BETAMETHASONE 1-0.05 % EX CREA: TOPICAL_CREAM | CUTANEOUS | 0 refills | Status: AC

## 2017-05-04 NOTE — ED Triage Notes (Signed)
Patient wants to be checked for STD. Patient states he has a rash inner thigh.

## 2017-05-04 NOTE — ED Provider Notes (Signed)
[facility]-EMERGENCY DEPT Provider Note   CSN: [id number] Arrival date & time: 05/04/17  0417    History   Chief Complaint Chief Complaint  Patient presents with  . Rash  . Exposure to STD    HPI Ronald Salas is a 32 y.o. male.  32 year old male with a history of HIV (CD4 count 940 on 04/11/17) presents to the emergency department for a rash to his penile shaft.  Patient noted this rash 2 days ago when he was experience pruritus to the area.  He denies any insertive intercourse for more than 6 months.  He did receive oral sex 1 month ago with protection.  He is unsure if this rash may be due to STDs.  He was seen a few weeks ago for rash to his groin.  He was diagnosed with eczema and has been using hydrocortisone for this.  He feels it is helping slightly, but denies resolution of this rash.  He is also tried cocoa butter and other topical lotions without relief.  He is unsure if this is related to the rash on his shaft.  No associated fevers.     Past Medical History:  Diagnosis Date  . Ankle injury   . HIV (human immunodeficiency virus infection) (HCC) 07/24/2016   Dx 10/2012  . Inguinal hernia, unilateral     Patient Active Problem List   Diagnosis Date Noted  . HIV (human immunodeficiency virus infection) (HCC) 07/24/2016  . Rash 07/24/2016    Past Surgical History:  Procedure Laterality Date  . INGUINAL HERNIA REPAIR          Home Medications    Prior to Admission medications   Medication Sig Start Date End Date Taking? Authorizing Provider  acetaminophen (TYLENOL) 500 MG tablet Take 500 mg by mouth every 6 (six) hours as needed for moderate pain.   Yes [provider]  elvitegravir-cobicistat-emtricitabine-tenofovir (GENVOYA) 150-150-200-10 MG TABS tablet Take 1 tablet by mouth daily with breakfast. Try to take at the same time each day with small amount of food 09/12/16  Yes Kuppelweiser, Cassie L, RPH-CPP  ciprofloxacin (CIPRO)  500 MG tablet Take 1 tablet (500 mg total) by mouth 2 (two) times daily. Patient not taking: Reported on 07/24/2016 05/05/15   Felicie MornSmith, David, NP  clotrimazole-betamethasone (LOTRISONE) cream Apply to affected area 2 times daily prn 05/04/17   Antony MaduraHumes, Sahan Pen, PA-C  doxycycline (VIBRA-TABS) 100 MG tablet Take 1 tablet (100 mg total) by mouth 2 (two) times daily. Patient not taking: Reported on 07/24/2016 03/24/14   Earley FavorSchulz, Gail, NP  phenazopyridine (PYRIDIUM) 100 MG tablet Take 1 tablet (100 mg total) by mouth 3 (three) times daily with meals. Patient not taking: Reported on 07/24/2016 03/24/14   Earley FavorSchulz, Gail, NP    Family History Family History  Problem Relation Age of Onset  . Hypertension Mother   . Hypertension Father     Social History Social History   Tobacco Use  . Smoking status: Current Some Day Smoker  . Smokeless tobacco: Never Used  Substance Use Topics  . Alcohol use: No  . Drug use: Yes    Types: Marijuana     Allergies   Codeine and Bee venom   Review of Systems Review of Systems Ten systems reviewed and are negative for acute change, except as noted in the HPI.    Physical Exam Updated Vital Signs BP 118/60 (BP Location: Left Arm)   Pulse 65   Temp 97.9 F (36.6 C) (Oral)  Resp 16   Ht 6' (1.829 m)   Wt 74.8 kg (165 lb)   SpO2 96%   BMI 22.38 kg/m   Physical Exam  Constitutional: He is oriented to person, place, and time. He appears well-developed and well-nourished. No distress.  Nontoxic and in NAD  HENT:  Head: Normocephalic and atraumatic.  Eyes: Conjunctivae and EOM are normal. No scleral icterus.  Neck: Normal range of motion.  Pulmonary/Chest: Effort normal. No respiratory distress.  Genitourinary:  Genitourinary Comments: Punctate papule x 2, coalesced, to the right midshaft of the penis. No penile discharge. Circumcised. Exam chaperoned by RN.  Musculoskeletal: Normal range of motion.  Neurological: He is alert and oriented to person, place,  and time.  Skin: Skin is warm and dry. No rash noted. He is not diaphoretic. No erythema. No pallor.  Darker skin discoloration to bilateral groin with associated itching, punctate papules. No skin sloughing.   Psychiatric: He has a normal mood and affect. His behavior is normal.  Nursing note and vitals reviewed.    ED Treatments / Results  Labs (all labs ordered are listed, but only abnormal results are displayed) Labs Reviewed  RPR  GC/CHLAMYDIA PROBE AMP ([place]) NOT AT [facility]    EKG None  Radiology No results found.  Procedures Procedures (including critical care time)  Medications Ordered in ED Medications - No data to display   Initial Impression / Assessment and Plan / ED Course  I have reviewed the triage vital signs and the nursing notes.  Pertinent labs & imaging results that were available during my care of the patient were reviewed by me and considered in my medical decision making (see chart for details).     32 year old male presents for a coalesced, punctate papular rash to the right midshaft of his penis.  Area is nonspecific.  Appears in consistent with a chancre, but will obtain RPR. GC/Chlamydia also pending.  Unclear if this may be secondary to HSV; however, patient has no known history of this and symptoms do not appear consistent with HSV flare.  No other vesicles or rash seen to genitalia.  Rash in the groin previously diagnosed as eczema.  There may be a fungal component to this.  Will discharge with Lotrisone for this.  Return precautions discussed and provided. Patient discharged in stable condition with no unaddressed concerns.   Final Clinical Impressions(s) / ED Diagnoses   Final diagnoses:  Papule of skin  Rash and nonspecific skin eruption    ED Discharge Orders        Ordered    clotrimazole-betamethasone (LOTRISONE) cream     05/04/17 0605       Antony Madura, PA-C 05/04/17 0626    Ward, Layla Maw, DO 05/04/17 [phone]

## 2017-05-05 LAB — RPR: RPR: REACTIVE — AB

## 2017-05-05 LAB — RPR, QUANT+TP ABS (REFLEX): T Pallidum Abs: POSITIVE — AB

## 2017-05-06 LAB — GC/CHLAMYDIA PROBE AMP (~~LOC~~) NOT AT ARMC
CHLAMYDIA, DNA PROBE: NEGATIVE
Neisseria Gonorrhea: NEGATIVE

## 2017-05-07 ENCOUNTER — Emergency Department (HOSPITAL_COMMUNITY)
Admission: EM | Admit: 2017-05-07 | Discharge: 2017-05-07 | Disposition: A | Discharge status: Home / Self Care | Payer: Self-pay | Attending: Physician Assistant | Admitting: Physician Assistant

## 2017-05-07 ENCOUNTER — Encounter (HOSPITAL_COMMUNITY): Payer: Self-pay | Admitting: Emergency Medicine

## 2017-05-07 DIAGNOSIS — Z79899 Other long term (current) drug therapy: Secondary | ICD-10-CM | POA: Insufficient documentation

## 2017-05-07 DIAGNOSIS — F17200 Nicotine dependence, unspecified, uncomplicated: Secondary | ICD-10-CM | POA: Insufficient documentation

## 2017-05-07 DIAGNOSIS — Z113 Encounter for screening for infections with a predominantly sexual mode of transmission: Secondary | ICD-10-CM | POA: Insufficient documentation

## 2017-05-07 DIAGNOSIS — R21 Rash and other nonspecific skin eruption: Secondary | ICD-10-CM | POA: Insufficient documentation

## 2017-05-07 DIAGNOSIS — Z21 Asymptomatic human immunodeficiency virus [HIV] infection status: Secondary | ICD-10-CM | POA: Insufficient documentation

## 2017-05-07 NOTE — ED Triage Notes (Signed)
Patient here from home with complaints of STD. Reports that he got a call today and stating that one of his labs was positive. Here for treatment.

## 2017-05-07 NOTE — ED Provider Notes (Signed)
[facility]   CSN: [id number] Arrival date & time: 05/07/17  1954     History   Chief Complaint Chief Complaint  Patient presents with  . SEXUALLY TRANSMITTED DISEASE    HPI Ronald Salas is a 32 y.o. male with PMH/o HIV (CD4 count 940 on 04/11/17), who presents for evaluation of concern of STD.  Patient was seen here in the ED on 05/04/17 for evaluation of rash.  Patient reports that he had a rash that began approximately few weeks ago.  At that time, he was also concerned about a small papule to the penis.  Patient states that he has had STD testing done at that time was discharged home with an antifungal cream.  Patient reports that he was called today and told 1 of his results was positive and to go to the emergency department for further evaluation.  Patient reports that he has had no insertive intercourse over the last 6 months.  He does report one incident of oral sex 1 month ago with protection.  Patient reports that he has not had any fevers, testicular pain or swelling, penile pain or swelling.  The history is provided by the patient.    Past Medical History:  Diagnosis Date  . Ankle injury   . HIV (human immunodeficiency virus infection) (HCC) 07/24/2016   Dx 10/2012  . Inguinal hernia, unilateral     Patient Active Problem List   Diagnosis Date Noted  . HIV (human immunodeficiency virus infection) (HCC) 07/24/2016  . Rash 07/24/2016    Past Surgical History:  Procedure Laterality Date  . INGUINAL HERNIA REPAIR          Home Medications    Prior to Admission medications   Medication Sig Start Date End Date Taking? Authorizing Provider  elvitegravir-cobicistat-emtricitabine-tenofovir (GENVOYA) 150-150-200-10 MG TABS tablet Take 1 tablet by mouth daily with breakfast. Try to take at the same time each day with small amount of food 09/12/16  Yes Kuppelweiser, Cassie L, RPH-CPP  ciprofloxacin (CIPRO) 500 MG tablet Take  1 tablet (500 mg total) by mouth 2 (two) times daily. Patient not taking: Reported on 07/24/2016 05/05/15   Felicie Morn, NP  clotrimazole-betamethasone (LOTRISONE) cream Apply to affected area 2 times daily prn 05/04/17   Antony Madura, PA-C  doxycycline (VIBRA-TABS) 100 MG tablet Take 1 tablet (100 mg total) by mouth 2 (two) times daily. Patient not taking: Reported on 07/24/2016 03/24/14   Earley Favor, NP  phenazopyridine (PYRIDIUM) 100 MG tablet Take 1 tablet (100 mg total) by mouth 3 (three) times daily with meals. Patient not taking: Reported on 07/24/2016 03/24/14   Earley Favor, NP    Family History Family History  Problem Relation Age of Onset  . Hypertension Mother   . Hypertension Father     Social History Social History   Tobacco Use  . Smoking status: Current Some Day Smoker  . Smokeless tobacco: Never Used  Substance Use Topics  . Alcohol use: No  . Drug use: Yes    Types: Marijuana     Allergies   Codeine and Bee venom   Review of Systems Review of Systems  Constitutional: Negative for fever.  Genitourinary: Negative for discharge, penile pain, penile swelling, scrotal swelling and testicular pain.     Physical Exam Updated Vital Signs BP 121/71 (BP Location: Right Arm)   Pulse 72   Temp 98.4 F (36.9 C) (Oral)   Resp 20   SpO2 98%  Physical Exam  Constitutional: He appears well-developed and well-nourished.  HENT:  Head: Normocephalic and atraumatic.  Eyes: Conjunctivae and EOM are normal. Right eye exhibits no discharge. Left eye exhibits no discharge. No scleral icterus.  Pulmonary/Chest: Effort normal.  Abdominal: Hernia confirmed negative in the right inguinal area and confirmed negative in the left inguinal area.  Genitourinary: Testes normal and penis normal. Right testis shows no swelling and no tenderness. Left testis shows no swelling and no tenderness. Circumcised.  Genitourinary Comments: The exam was performed with a chaperone present.  Normal male genitalia. No evidence of rash, ulcers or lesions. Punctate papule noted ot the midshaft. No ulcer noted.   Neurological: He is alert.  Cranial nerves III-XII intact Follows commands, Moves all extremities  5/5 strength to BUE and BLE  Sensation intact throughout all major nerve distributions Normal finger to nose. No dysdiadochokinesia. No pronator drift. No gait abnormalities  No slurred speech. No facial droop.   Skin: Skin is warm and dry.  Psychiatric: He has a normal mood and affect. His speech is normal and behavior is normal.  Nursing note and vitals reviewed.    ED Treatments / Results  Labs (all labs ordered are listed, but only abnormal results are displayed) Labs Reviewed - No data to display  EKG None  Radiology No results found.  Procedures Procedures (including critical care time)  Medications Ordered in ED Medications - No data to display   Initial Impression / Assessment and Plan / ED Course  I have reviewed the triage vital signs and the nursing notes.  Pertinent labs & imaging results that were available during my care of the patient were reviewed by me and considered in my medical decision making (see chart for details).     32 year old male with past medical history of HIV who presents for evaluation of concern for STD.  Seen here on 05/04/17 and was called today and told to come to the emergency department because 1 of his results was positive.  Reports he has been having a rash to his penis but otherwise no ulcers, testicular pain or swelling.  Reports his last sexual intercourse experience was 6 months ago.  Did report one evidence of oral intercourse.  Patient was called today and said that 1 of his results from the other day was positive.  Review of records show the patient was reactive for RPR.  He does report a history of syphilis that is been ongoing for the last 16 years.  He has been he was successfully treated initially when he  first was positive. He has had previous positive reactive results since then.  Quant was done and showed that it was 1:2.  He had previously been 1:1 nine months ago.  Will plan to consult ID for further evaluation.  GU exam shows small papule to the mid shaft. Does not appear to be a chancre.   Discussed patient with Dr. Ninetta LightsHatcher (ID) regarding quant results.  Does not recommend treatment at this time. He will plan to have patient follow-up with ID office this week. Updated patient on plan. Instructed him to follow-up with ID this week. Encouraged use of previously prescribed cream to help with inguinal rash. Patient had ample opportunity for questions and discussion. All patient's questions were answered with full understanding. Strict return precautions discussed. Patient expresses understanding and agreement to plan.    Final Clinical Impressions(s) / ED Diagnoses   Final diagnoses:  Rash    ED Discharge Orders  None       Rosana Hoes 05/08/17 0157    Abelino Derrick, MD 05/09/17 [phone]

## 2017-05-07 NOTE — Discharge Instructions (Signed)
As we discussed, you will need to follow-up with the infectious disease office.  Call their office tomorrow and arrange for an appointment to be seen this week.  Continue using the cream as directed.  Return to emergency department for any worsening rash, sore on her penis, discharge from penis, swelling or pain of your testicles or any other worsening or concerning symptoms.

## 2017-05-07 NOTE — ED Notes (Signed)
Bed: WA03 Expected date:  Expected time:  Means of arrival:  Comments: 

## 2017-06-03 ENCOUNTER — Encounter (HOSPITAL_COMMUNITY): Payer: Self-pay | Admitting: Emergency Medicine

## 2017-06-03 ENCOUNTER — Emergency Department (HOSPITAL_COMMUNITY)
Admission: EM | Admit: 2017-06-03 | Discharge: 2017-06-03 | Disposition: A | Discharge status: Home / Self Care | Payer: Self-pay | Attending: Emergency Medicine | Admitting: Emergency Medicine

## 2017-06-03 ENCOUNTER — Other Ambulatory Visit: Payer: Self-pay

## 2017-06-03 DIAGNOSIS — F17200 Nicotine dependence, unspecified, uncomplicated: Secondary | ICD-10-CM | POA: Insufficient documentation

## 2017-06-03 DIAGNOSIS — B20 Human immunodeficiency virus [HIV] disease: Secondary | ICD-10-CM | POA: Insufficient documentation

## 2017-06-03 DIAGNOSIS — Z79899 Other long term (current) drug therapy: Secondary | ICD-10-CM | POA: Insufficient documentation

## 2017-06-03 DIAGNOSIS — R21 Rash and other nonspecific skin eruption: Secondary | ICD-10-CM | POA: Insufficient documentation

## 2017-06-03 MED ORDER — PENICILLIN G BENZATHINE 1200000 UNIT/2ML IM SUSP
2.4000 10*6.[IU] | Freq: Once | INTRAMUSCULAR | Status: AC
  Administered 2017-06-03: 2.4 10*6.[IU] via INTRAMUSCULAR
  Filled 2017-06-03: qty 4

## 2017-06-03 NOTE — ED Provider Notes (Signed)
[facility]-EMERGENCY DEPT Provider Note   CSN: [id number] Arrival date & time: 06/03/17  0230     History   Chief Complaint Chief Complaint  Patient presents with  . Rash    HPI Ronald Salas is a 32 y.o. male.  HPI 32 year old Afro-American male past medical history significant for HIV, CD4 count 940 on 04/11/2017), history of syphilis that presents to the emergency department today for rash.  Patient states that he has been seen several times in the past month for same.  Patient reports that a rash began approximately 4 weeks ago.  He states that he has an ulcer to the right side of his penis.  Patient states on 05/07/2017 he came to the ED and to be informed that his RPR was positive. Patient reports that he has had no insertive intercourse over the last 6 months.  He does report one incident of oral sex 1 month ago with protection.  Patient reports that he has not had any fevers, testicular pain or swelling, penile pain or swelling.  States the rash is very pruritic.  They did not treat him at that time.  Patient was suppose to follow-up with infectious disease but states he has not been able to get into see them.  Denies any rash to hands or soles.  Patient denies any fevers.  Denies any other associated symptoms. Past Medical History:  Diagnosis Date  . Ankle injury   . HIV (human immunodeficiency virus infection) (HCC) 07/24/2016   Dx 10/2012  . Inguinal hernia, unilateral     Patient Active Problem List   Diagnosis Date Noted  . HIV (human immunodeficiency virus infection) (HCC) 07/24/2016  . Rash 07/24/2016    Past Surgical History:  Procedure Laterality Date  . INGUINAL HERNIA REPAIR          Home Medications    Prior to Admission medications   Medication Sig Start Date End Date Taking? Authorizing Provider  ciprofloxacin (CIPRO) 500 MG tablet Take 1 tablet (500 mg total) by mouth 2 (two) times daily. Patient not taking: Reported on 07/24/2016  05/05/15   Felicie Morn, NP  clotrimazole-betamethasone (LOTRISONE) cream Apply to affected area 2 times daily prn 05/04/17   Antony Madura, PA-C  doxycycline (VIBRA-TABS) 100 MG tablet Take 1 tablet (100 mg total) by mouth 2 (two) times daily. Patient not taking: Reported on 07/24/2016 03/24/14   Earley Favor, NP  elvitegravir-cobicistat-emtricitabine-tenofovir (GENVOYA) 150-150-200-10 MG TABS tablet Take 1 tablet by mouth daily with breakfast. Try to take at the same time each day with small amount of food 09/12/16   Kuppelweiser, Cassie L, RPH-CPP  phenazopyridine (PYRIDIUM) 100 MG tablet Take 1 tablet (100 mg total) by mouth 3 (three) times daily with meals. Patient not taking: Reported on 07/24/2016 03/24/14   Earley Favor, NP    Family History Family History  Problem Relation Age of Onset  . Hypertension Mother   . Hypertension Father     Social History Social History   Tobacco Use  . Smoking status: Current Some Day Smoker  . Smokeless tobacco: Never Used  Substance Use Topics  . Alcohol use: No  . Drug use: Yes    Types: Marijuana     Allergies   Codeine and Bee venom   Review of Systems Review of Systems  All other systems reviewed and are negative.    Physical Exam Updated Vital Signs BP 118/78 (BP Location: Left Arm)   Pulse (!) 50   Temp  98 F (36.7 C) (Oral)   Resp 14   SpO2 100%   Physical Exam  Constitutional: He appears well-developed and well-nourished. No distress.  HENT:  Head: Normocephalic and atraumatic.  Eyes: Right eye exhibits no discharge. Left eye exhibits no discharge. No scleral icterus.  Neck: Normal range of motion.  Pulmonary/Chest: No respiratory distress.  Genitourinary:  Genitourinary Comments: Chaperone present for exam. Circumcised/ male. No penile discharge, erythema, tenderness, or rash. Punctate papule noted ot the midshaft. 2 descended testes without swelling, pain, lesions or rash.  Inguinal lymphadenopathy noted.  Mildly tender  to palpation.   Musculoskeletal: Normal range of motion.  Neurological: He is alert.  Skin: Skin is warm and dry. Capillary refill takes less than 2 seconds. No pallor.  Psychiatric: His behavior is normal. Judgment and thought content normal.  Nursing note and vitals reviewed.    ED Treatments / Results  Labs (all labs ordered are listed, but only abnormal results are displayed) Labs Reviewed - No data to display  EKG None  Radiology No results found.  Procedures Procedures (including critical care time)  Medications Ordered in ED Medications  penicillin g benzathine (BICILLIN LA) 1200000 UNIT/2ML injection 2.4 Million Units (2.4 Million Units Intramuscular Given 06/03/17 0530)     Initial Impression / Assessment and Plan / ED Course  I have reviewed the triage vital signs and the nursing notes.  Pertinent labs & imaging results that were available during my care of the patient were reviewed by me and considered in my medical decision making (see chart for details).     Patient presents to the emergency department today for evaluation of rash to the penis that was informed for syphilis several weeks ago.  States that he was not treated at that time.  Patient has not been to follow-up with ID.  Denies any systemic symptoms.  Denies any diffuse rash.  Denies any neurological changes.  Patient does have a punctate lesion to the right side of the shaft penis that is mildly ulcerated.  No overlying cellulitis.  Nontender.  I did review prior note from prior ED visit provider at that time talked with infectious disease who recommended that patient not be treated given that the ratio was 1-1.  Patient was with follow with ID which has not done so.  Patient is demanding treatment with penicillin today in the ED.  Will treat with Bicillin.  Patient encouraged follow-up with ID tomorrow.  States he will go to their office.  No signs of secondary or tertiary sepsis at this time.  Pt is  hemodynamically stable, in NAD, & able to ambulate in the ED. Evaluation does not show pathology that would require ongoing emergent intervention or inpatient treatment. I explained the diagnosis to the patient. Pain has been managed & has no complaints prior to dc. Pt is comfortable with above plan and is stable for discharge at this time. All questions were answered prior to disposition. Strict return precautions for f/u to the ED were discussed. Encouraged follow up with PCP.    Final Clinical Impressions(s) / ED Diagnoses   Final diagnoses:  Rash    ED Discharge Orders    None       Wallace Keller 06/03/17 2204    Palumbo, April, MD 06/04/17 0104

## 2017-06-03 NOTE — ED Notes (Signed)
Pt to desk wanting to know when he will be seen  Informed pt he had been signed up for so it should not be long

## 2017-06-03 NOTE — Discharge Instructions (Addendum)
Please make sure that you follow-up with your infectious disease doctor tomorrow.  Return the ED with any worsening symptoms.

## 2017-06-03 NOTE — ED Triage Notes (Signed)
Pt states he was seen on April 2nd for STD testing and tested positive for syphilis  Pt states he still has the rash and itching and needs treatment

## 2017-06-03 NOTE — ED Notes (Signed)
ED Provider at bedside. 

## 2017-06-18 ENCOUNTER — Telehealth: Payer: Self-pay | Admitting: *Deleted

## 2017-06-18 NOTE — Telephone Encounter (Signed)
Patient called and was very rude. He advised he has been trying to call for over a week and has not been able to get through. He is not happy with the service here. He also advised he has a itching all over his body and needs to be seen. Gave him an appointment with Tammy Sours, 05/20/17 at 230 pm, as that is our first available.

## 2017-06-19 ENCOUNTER — Ambulatory Visit: Payer: Self-pay | Admitting: Family

## 2017-06-19 NOTE — Progress Notes (Deleted)
   Subjective:    Patient ID: Ronald Salas, male    DOB: Nov 19, 1985, 32 y.o.   MRN: [id number]  No chief complaint on file.    HPI:  Ronald Salas is a 32 y.o. male who presents today for an acute office visit.   This is a new problem. Associated symptoms of itching and a rash located has been going on for about 1 month and been refractory to the modifying factors of Lotrisone, Bicillin, and over the counter cocoa butter. He has been seen in the ED on 3 separate occasions. Lab work had positive RPR titer of 1:2, however previous titer was 1:1 and through the recommendation of ID provider on call no treatment was indicated for Syphilis at that time. He did receive 1 injection in the ED as he "demanded treatment." Since receiving the Bicillin injection    Allergies  Allergen Reactions  . Codeine Other (See Comments)    Stomach pains  . Bee Venom Hives    Hives       Outpatient Medications Prior to Visit  Medication Sig Dispense Refill  . ciprofloxacin (CIPRO) 500 MG tablet Take 1 tablet (500 mg total) by mouth 2 (two) times daily. (Patient not taking: Reported on 07/24/2016) 14 tablet 0  . clotrimazole-betamethasone (LOTRISONE) cream Apply to affected area 2 times daily prn 15 g 0  . doxycycline (VIBRA-TABS) 100 MG tablet Take 1 tablet (100 mg total) by mouth 2 (two) times daily. (Patient not taking: Reported on 07/24/2016) 13 tablet 0  . elvitegravir-cobicistat-emtricitabine-tenofovir (GENVOYA) 150-150-200-10 MG TABS tablet Take 1 tablet by mouth daily with breakfast. Try to take at the same time each day with small amount of food 30 tablet 5  . phenazopyridine (PYRIDIUM) 100 MG tablet Take 1 tablet (100 mg total) by mouth 3 (three) times daily with meals. (Patient not taking: Reported on 07/24/2016) 10 tablet 0   No facility-administered medications prior to visit.      Past Medical History:  Diagnosis Date  . Ankle injury   . HIV (human immunodeficiency virus infection) (HCC)  07/24/2016   Dx 10/2012  . Inguinal hernia, unilateral      Past Surgical History:  Procedure Laterality Date  . INGUINAL HERNIA REPAIR         Review of Systems    Objective:    There were no vitals taken for this visit. Nursing note and vital signs reviewed.  Physical Exam     Assessment & Plan:   Problem List Items Addressed This Visit    None       I am having Merlene Pulling maintain his doxycycline, phenazopyridine, ciprofloxacin, elvitegravir-cobicistat-emtricitabine-tenofovir, and clotrimazole-betamethasone.   No orders of the defined types were placed in this encounter.    Follow-up: No follow-ups on file.   Marcos Eke, MSN, [facility]

## 2017-06-25 ENCOUNTER — Telehealth: Payer: Self-pay

## 2017-06-25 NOTE — Telephone Encounter (Signed)
PT called today requesting an appt for labs and an appt to Marcos Eke, NP. Pt stated during his call that he has a rash on his inner thigh that has been present for a week now. Pt stated he has tried over the counter creams, but they have not helped with the itching. Pt complains that the rash is dry and itchy. Pt is scheduled too Tammy Sours 06/26/17 at 1:45 pt would like to do labs same day. Lorenso Courier, [place]

## 2017-06-26 ENCOUNTER — Encounter: Payer: Self-pay | Admitting: Family

## 2017-06-26 ENCOUNTER — Ambulatory Visit (INDEPENDENT_AMBULATORY_CARE_PROVIDER_SITE_OTHER): Payer: Self-pay | Admitting: Family

## 2017-06-26 VITALS — BP 138/84 | HR 60 | Temp 98.3°F | Ht 72.0 in | Wt 158.1 lb

## 2017-06-26 DIAGNOSIS — Z21 Asymptomatic human immunodeficiency virus [HIV] infection status: Secondary | ICD-10-CM

## 2017-06-26 DIAGNOSIS — R21 Rash and other nonspecific skin eruption: Secondary | ICD-10-CM

## 2017-06-26 MED ORDER — FLUCONAZOLE 150 MG PO TABS: ORAL_TABLET | ORAL | 1 refills | Status: AC

## 2017-06-26 MED ORDER — ELVITEG-COBIC-EMTRICIT-TENOFAF 150-150-200-10 MG PO TABS: 1.0000 | ORAL_TABLET | Freq: Every day | ORAL | 5 refills | Status: DC

## 2017-06-26 NOTE — Patient Instructions (Addendum)
Good to see you.  Please continue to take your Genvoya.  Start the fluconazole for your rash. Repeat for up to 4 weeks if needed.  Plan to follow up with Judeth Cornfield in 3 months or sooner if needed.  Jock Itch Jock itch (tinea cruris) is a fungal infection of the skin in the groin area. It is sometimes called ringworm, even though it is not caused by worms. It is caused by a fungus, which is a type of germ that thrives in dark, damp places. Jock itch causes a rash and itching in the groin and upper thigh area. It usually goes away in 2-3 weeks with treatment. What are the causes? The fungus that causes jock itch may be spread by:  Touching a fungus infection elsewhere on your body-such as athlete's foot-and then touching your groin area.  Sharing towels or clothing with an infected person.  What increases the risk? Jock itch is most common in men and adolescent boys. This condition is more likely to develop from:  Being in hot, humid climates.  Wearing tight-fitting clothing or wet bathing suits for long periods of time.  Participating in sports.  Being overweight.  Having diabetes.  What are the signs or symptoms? Symptoms of jock itch may include:  A red, pink, or brown rash in the groin area. The rash may spread to the thighs, anus, and buttocks.  Dry and scaly skin on or around the rash.  Itchiness.  How is this diagnosed? Most often, a health care provider can make the diagnosis by looking at your rash. Sometimes, a scraping of the infected skin will be taken. This sample may be tested by looking at it under a microscope or by trying to grow the fungus from the sample (culture). How is this treated? Treatment for this condition may include:  Antifungal medicine to kill the fungus. This may be in various forms: ? Skin cream or ointment. ? Medicine taken by mouth.  Skin cream or ointment to reduce the itching.  Compresses or medicated powders to dry the infected  skin.  Follow these instructions at home:  Take medicines only as directed by your health care provider. Apply skin creams or ointments exactly as directed.  Wear loose-fitting clothing. ? Men should wear cotton boxer shorts. ? Women should wear cotton underwear.  Change your underwear every day to keep your groin dry.  Avoid hot baths.  Dry your groin area well after bathing. ? Use a separate towel to dry your groin area. This will help to prevent a spreading of the infection to other areas of your body.  Do not scratch the affected area.  Do not share towels with other people. Contact a health care provider if:  Your rash does not improve or it gets worse after 2 weeks of treatment.  Your rash is spreading.  Your rash returns after treatment is finished.  You have a fever.  You have redness, swelling, or pain in the area around your rash.  You have fluid, blood, or pus coming from your rash.  Your have your rash for more than 4 weeks. This information is not intended to replace advice given to you by your health care provider. Make sure you discuss any questions you have with your health care provider. Document Released: 01/12/2002 Document Revised: 06/30/2015 Document Reviewed: 11/03/2013 Elsevier Interactive Patient Education  [employer].

## 2017-06-26 NOTE — Assessment & Plan Note (Addendum)
Rash in groin appears consistent with Tinea cruris that is improved per patient with lotrisone cream. Will start fluconazole to assist with resolution. Encouraged to wear cotton underwear and use powders such as Goldbond to decrease moisture which can worsen symptoms. No other rashes or lesions appreciated. There is concern for syphilis with his most recent titer being 1:2.  No treatment is indicated at this time.

## 2017-06-26 NOTE — Assessment & Plan Note (Addendum)
HIV appears to be controlled with the current medication regimen although there is concern based on refills that medication is not being taken as prescribed which could lead to susceptibility towards resistance in the future. Last viral load was undetectable and CD4 count was 940. 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 Recommend Prevnar at next office visit. Continue current dosage of Genvoya. Plan to follow up in 3 months or sooner if needed.

## 2017-06-26 NOTE — Progress Notes (Signed)
Subjective:    Patient ID: Ronald Salas, male    DOB: 07-Mar-1985, 32 y.o.   MRN: [id number]  No chief complaint on file.    HPI:  Ronald Salas is a 32 y.o. male who presents today for an acute office visit and follow up of his HIV disease.  1.) Rash - This is a new problem. Describes a rash located on the head of his penis as well as small white heads located on his hands within the last 2 weeks. Described as dry and itchy with no discharge or discomfort. Denies urinary frequency, urgency, or dysuria. Modifying factors include neosporin which has not helped very much. He continues to use the lotrisone cream prescribed by the ED.    2.) HIV - Last seen in thffice of 07/24/16. He was previously taking Stribild and was changed to [facility] for safety of tenofovir. His most recent viral load was undetectable on 04/11/17 with a CD4 count of 940. Describes that it is difficult to get an appointment here. Reports the medication as prescribed and maybe misses about 2-3 doses a month if he is out of town. His living situation is stable and he is self-employed as a Contractor. Has had a couple of headaches that are easily treated. No changes in vision.    Allergies  Allergen Reactions  . Codeine Other (See Comments)    Stomach pains  . Bee Venom Hives    Hives       Outpatient Medications Prior to Visit  Medication Sig Dispense Refill  . elvitegravir-cobicistat-emtricitabine-tenofovir (GENVOYA) 150-150-200-10 MG TABS tablet Take 1 tablet by mouth daily with breakfast. Try to take at the same time each day with small amount of food 30 tablet 5  . clotrimazole-betamethasone (LOTRISONE) cream Apply to affected area 2 times daily prn 15 g 0  . ciprofloxacin (CIPRO) 500 MG tablet Take 1 tablet (500 mg total) by mouth 2 (two) times daily. (Patient not taking: Reported on 07/24/2016) 14 tablet 0  . doxycycline (VIBRA-TABS) 100 MG tablet Take 1 tablet (100 mg total) by mouth 2 (two) times daily.  (Patient not taking: Reported on 07/24/2016) 13 tablet 0  . phenazopyridine (PYRIDIUM) 100 MG tablet Take 1 tablet (100 mg total) by mouth 3 (three) times daily with meals. (Patient not taking: Reported on 07/24/2016) 10 tablet 0   No facility-administered medications prior to visit.      Past Medical History:  Diagnosis Date  . Ankle injury   . HIV (human immunodeficiency virus infection) (HCC) 07/24/2016   Dx 10/2012  . Inguinal hernia, unilateral      Past Surgical History:  Procedure Laterality Date  . INGUINAL HERNIA REPAIR         Review of Systems  Constitutional: Negative for activity change, appetite change, diaphoresis, fatigue, fever and unexpected weight change.  HENT: Negative for congestion, sinus pressure and sore throat.   Respiratory: Negative for cough, chest tightness, shortness of breath and wheezing.   Cardiovascular: Negative for chest pain and leg swelling.  Gastrointestinal: Negative for abdominal pain, constipation, diarrhea, nausea and vomiting.  Genitourinary: Negative for dysuria, flank pain, frequency, genital sores, hematuria and urgency.  Skin: Positive for rash.  Neurological: Negative for weakness and headaches.      Objective:    BP 138/84   Pulse 60   Temp 98.3 F (36.8 C)   Ht 6' (1.829 m)   Wt 158 lb 1.9 oz (71.7 kg)   SpO2 100%   BMI 21.44 kg/m  Nursing note and vital signs reviewed.  Physical Exam  Constitutional: He is oriented to person, place, and time. He appears well-developed and well-nourished. No distress.  Cardiovascular: Normal rate, regular rhythm, normal heart sounds and intact distal pulses. Exam reveals no gallop and no friction rub.  No murmur heard. Pulmonary/Chest: Effort normal and breath sounds normal. No stridor. No respiratory distress. He has no wheezes. He has no rales. He exhibits no tenderness.  Neurological: He is alert and oriented to person, place, and time.  Skin: Skin is warm and dry. Rash (Located  in bilateral groins darker than skin color, semicircle shape with distinct borders.) noted.  Psychiatric: He has a normal mood and affect. His behavior is normal. Judgment and thought content normal.       Assessment & Plan:   Problem List Items Addressed This Visit      Musculoskeletal and Integument   Rash    Rash in groin appears consistent with Tinea cruris that is improved per patient with lotrisone cream. Will start fluconazole to assist with resolution. Encouraged to wear cotton underwear and use powders such as Goldbond to decrease moisture which can worsen symptoms. No other rashes or lesions appreciated. There is concern for syphilis with his most recent titer being 1:2.  No treatment is indicated at this time.       Relevant Medications   fluconazole (DIFLUCAN) 150 MG tablet     Other   HIV (human immunodeficiency virus infection) (HCC) - Primary (Chronic)    HIV appears to be controlled with the current medication regimen although there is concern based on refills that medication is not being taken as prescribed which could lead to susceptibility towards resistance in the future. Last viral load was undetectable and CD4 count was 940. 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 Recommend Prevnar at next office visit. Continue current dosage of Genvoya. Plan to follow up in 3 months or sooner if needed.        Relevant Medications   elvitegravir-cobicistat-emtricitabine-tenofovir (GENVOYA) 150-150-200-10 MG TABS tablet   fluconazole (DIFLUCAN) 150 MG tablet       I have discontinued Raylee Eisenberger's doxycycline, phenazopyridine, and ciprofloxacin. I am also having him start on fluconazole. Additionally, I am having him maintain his clotrimazole-betamethasone and elvitegravir-cobicistat-emtricitabine-tenofovir.   Meds ordered this encounter  Medications  . elvitegravir-cobicistat-emtricitabine-tenofovir (GENVOYA) 150-150-200-10 MG TABS tablet     Sig: Take 1 tablet by mouth daily with breakfast. Try to take at the same time each day with small amount of food    Dispense:  30 tablet    Refill:  5    Order Specific Question:   Supervising Provider    Answer:   Judyann Munson [[id number]]  . fluconazole (DIFLUCAN) 150 MG tablet    Sig: Take 1 tablet by mouth weekly.    Dispense:  2 tablet    Refill:  1    Order Specific Question:   Supervising Provider    Answer:   Judyann Munson [[id number]]     Follow-up: Return in about 3 months (around 09/26/2017), or if symptoms worsen or fail to improve.   Marcos Eke, MSN, [facility]

## 2017-07-20 ENCOUNTER — Encounter (HOSPITAL_COMMUNITY): Payer: Self-pay | Admitting: Emergency Medicine

## 2017-07-20 ENCOUNTER — Other Ambulatory Visit: Payer: Self-pay

## 2017-07-20 ENCOUNTER — Emergency Department (HOSPITAL_COMMUNITY)
Admission: EM | Admit: 2017-07-20 | Discharge: 2017-07-21 | Disposition: A | Discharge status: Home / Self Care | Payer: Self-pay | Attending: Emergency Medicine | Admitting: Emergency Medicine

## 2017-07-20 DIAGNOSIS — Z79899 Other long term (current) drug therapy: Secondary | ICD-10-CM | POA: Insufficient documentation

## 2017-07-20 DIAGNOSIS — F17200 Nicotine dependence, unspecified, uncomplicated: Secondary | ICD-10-CM | POA: Insufficient documentation

## 2017-07-20 DIAGNOSIS — B86 Scabies: Secondary | ICD-10-CM | POA: Insufficient documentation

## 2017-07-20 DIAGNOSIS — B20 Human immunodeficiency virus [HIV] disease: Secondary | ICD-10-CM | POA: Insufficient documentation

## 2017-07-20 NOTE — ED Triage Notes (Signed)
Pt states he has had hives and itching for 2 weeks all over  Switched to a more gentle soap and stopped drinking sodas to try to resolve this but has had no relief.

## 2017-07-21 MED ORDER — PERMETHRIN 5 % EX CREA: TOPICAL_CREAM | CUTANEOUS | 1 refills | Status: AC

## 2017-07-21 NOTE — ED Provider Notes (Signed)
[facility] EMERGENCY DEPARTMENT Provider Note   CSN: [id number] Arrival date & time: 07/20/17  2302     History   Chief Complaint Chief Complaint  Patient presents with  . Rash    HPI Ronald Salas is a 32 y.o. male.  Patient presents to the emergency department with a chief complaint of rash.  He states that he has had a very itchy rash for the past several weeks.  He is uncertain when he got it.  He denies any associated fevers.  Denies any association with new soaps, detergents, or medications.  He reports recently being treated for syphilis.  He denies any penile discharge, dysuria, or testicular tenderness.  The history is provided by the patient. No language interpreter was used.    Past Medical History:  Diagnosis Date  . Ankle injury   . HIV (human immunodeficiency virus infection) (HCC) 07/24/2016   Dx 10/2012  . Inguinal hernia, unilateral     Patient Active Problem List   Diagnosis Date Noted  . HIV (human immunodeficiency virus infection) (HCC) 07/24/2016  . Rash 07/24/2016    Past Surgical History:  Procedure Laterality Date  . INGUINAL HERNIA REPAIR          Home Medications    Prior to Admission medications   Medication Sig Start Date End Date Taking? Authorizing Provider  clotrimazole-betamethasone (LOTRISONE) cream Apply to affected area 2 times daily prn 05/04/17   Antony Madura, PA-C  elvitegravir-cobicistat-emtricitabine-tenofovir (GENVOYA) 150-150-200-10 MG TABS tablet Take 1 tablet by mouth daily with breakfast. Try to take at the same time each day with small amount of food 06/26/17   Veryl Speak, FNP  fluconazole (DIFLUCAN) 150 MG tablet Take 1 tablet by mouth weekly. 06/26/17   Veryl Speak, FNP  permethrin (ELIMITE) 5 % cream Apply to entire body other than face - let sit for 14 hours then wash off, may repeat in 1 week if still having symptoms 07/21/17   Roxy Horseman, PA-C    Family History Family History    Problem Relation Age of Onset  . Hypertension Mother   . Hypertension Father     Social History Social History   Tobacco Use  . Smoking status: Current Some Day Smoker  . Smokeless tobacco: Never Used  Substance Use Topics  . Alcohol use: No  . Drug use: Yes    Types: Marijuana     Allergies   Codeine and Bee venom   Review of Systems Review of Systems  All other systems reviewed and are negative.    Physical Exam Updated Vital Signs BP 116/73 (BP Location: Right Arm)   Pulse 63   Temp 98.5 F (36.9 C) (Oral)   Resp 18   Ht 6' (1.829 m)   Wt 74.8 kg (165 lb)   SpO2 100%   BMI 22.38 kg/m   Physical Exam  Constitutional: He is oriented to person, place, and time. No distress.  HENT:  Head: Normocephalic and atraumatic.  Eyes: Pupils are equal, round, and reactive to light. Conjunctivae and EOM are normal.  Neck: No tracheal deviation present.  Cardiovascular: Normal rate.  Pulmonary/Chest: Effort normal. No respiratory distress.  Abdominal: Soft.  Musculoskeletal: Normal range of motion.  Neurological: He is alert and oriented to person, place, and time.  Skin: Skin is warm and dry. He is not diaphoretic.  Patient with excoriations, and small sores of the webspaces, extremities, scrotum, and glans   Psychiatric: Judgment normal.  Nursing  note and vitals reviewed.    ED Treatments / Results  Labs (all labs ordered are listed, but only abnormal results are displayed) Labs Reviewed - No data to display  EKG None  Radiology No results found.  Procedures Procedures (including critical care time)  Medications Ordered in ED Medications - No data to display   Initial Impression / Assessment and Plan / ED Course  I have reviewed the triage vital signs and the nursing notes.  Pertinent labs & imaging results that were available during my care of the patient were reviewed by me and considered in my medical decision making (see chart for  details).     Patient with pruritic rash.  The lesions are very characteristic of scabies.  He has no penile discharge.  He has no rash on the palms or soles of his feet.  He was recently treated for syphilis.  This does not appear like secondary syphilis.  He was seen recently in the infectious disease clinic, and was treated for possible jock itch.  I do not believe his symptoms to be fungal, but believe them to be scabies.  We will treat with permethrin.  Final Clinical Impressions(s) / ED Diagnoses   Final diagnoses:  Scabies    ED Discharge Orders        Ordered    permethrin (ELIMITE) 5 % cream     07/21/17 0146       Roxy HorsemanBrowning, Yasmina [place], PA-C 07/21/17 0156    Mesner, Barbara CowerJason, MD 07/21/17 2304

## 2017-07-22 ENCOUNTER — Telehealth: Payer: Self-pay

## 2017-07-22 NOTE — Telephone Encounter (Signed)
Reviewed [facility] formulary with nothing that is listed. He can try the permetharin cream using GoodRx.com or lowestmed.com with average prices of about $38.00 depending the the pharmacy. If that is not feasible we can try to figure out another medication.

## 2017-07-22 NOTE — Telephone Encounter (Signed)
PT called today stating he was seen in ED due to rash which turned out to be scabies. Pt stated the ER had prescribed Permethrin 5% cream however, the cream itself is $200 out of pocket. PT was told by pharmacist to reach out to his ID doctor to see if we could prescribe something that is covered under [employer]. Will send a message to Marcos EkeGreg Calone, NP to see if we can send something to pt to treat scabies.  Lorenso CourierJose L Keiaira Donlan, [place]

## 2017-07-23 NOTE — Telephone Encounter (Signed)
Called pt to inform him of the Goodrx coupon for Permetharin Cream that would drop his medication from $200 to $38. Pt stated that he would prefer an alternative medication since the price is still a little high for him. Pt did state if no alternative medication is available he would try his best to pay the medication with the Goodrx coupon. Lorenso CourierJose L Lamarcus Spira, [place]

## 2017-08-27 ENCOUNTER — Ambulatory Visit (INDEPENDENT_AMBULATORY_CARE_PROVIDER_SITE_OTHER): Payer: Self-pay | Admitting: Infectious Diseases

## 2017-08-27 ENCOUNTER — Encounter: Payer: Self-pay | Admitting: Infectious Diseases

## 2017-08-27 ENCOUNTER — Telehealth: Payer: Self-pay

## 2017-08-27 VITALS — BP 116/74 | HR 60 | Temp 98.6°F | Wt 161.0 lb

## 2017-08-27 DIAGNOSIS — Z8619 Personal history of other infectious and parasitic diseases: Secondary | ICD-10-CM

## 2017-08-27 DIAGNOSIS — Z23 Encounter for immunization: Secondary | ICD-10-CM

## 2017-08-27 DIAGNOSIS — Z21 Asymptomatic human immunodeficiency virus [HIV] infection status: Secondary | ICD-10-CM

## 2017-08-27 NOTE — Patient Instructions (Addendum)
Nice to see you today.   Continue taking your Genvoya once a day as you are - will check some labs today for you.   Coconut oil to your skin. Mederma cream is the over the counter scar lightening cream.   We gave you your prevnar today - would like to offer you the pneumovax at your next appointmnent. These both protect you against Pneumonias.   Please sign up with MyChart to access your labs and set up email communication with our clinic for non-urgent medical concerns.   Please schedule an appointment with [name] or [name] for re-enrollment in insurance - this is every January and July. Will need 2 months worth of pay stubs for this process.   Please come back in 4 months to see Ronald Salas again.

## 2017-08-27 NOTE — Telephone Encounter (Signed)
PT called today requesting an appt to see Marcos EkeGreg Calone, NP, however NP is not in the office until first week of August. PT requested that he be seen by someone as soon as possible to have a look at a rash that NP has tried to treat in last visit. PT states the rash has spread from both lower thighs, lower back and buttocks. Scheduled an appt for pt to be seen by Rexene AlbertsStephanie Dixon, NP today at 3:45. PT also requested to speak to someone in office regarding his issues with reaching scheduling. Transferred pt's call to Nurse manager Tomasita Morrowammy King for pt. Will inform Marcos EkeGreg Calone, NP that pt called today and would like to be seen this week to have his rash looked at and to also have labs same day.  Lorenso CourierJose L Maldonado, [place]

## 2017-08-27 NOTE — Telephone Encounter (Signed)
Agree with follow up. Dermatology referral was previously placed.

## 2017-08-28 LAB — COMPLETE METABOLIC PANEL WITH GFR
AG Ratio: 1.2 (calc) (ref 1.0–2.5)
ALKALINE PHOSPHATASE (APISO): 68 U/L (ref 40–115)
ALT: 18 U/L (ref 9–46)
AST: 19 U/L (ref 10–40)
Albumin: 4.1 g/dL (ref 3.6–5.1)
BUN: 9 mg/dL (ref 7–25)
CO2: 30 mmol/L (ref 20–32)
Calcium: 9.6 mg/dL (ref 8.6–10.3)
Chloride: 101 mmol/L (ref 98–110)
Creat: 0.86 mg/dL (ref 0.60–1.35)
GFR, Est African American: 136 mL/min/{1.73_m2} (ref >=60)
GFR, Est Non African American: 117 mL/min/{1.73_m2} (ref >=60)
Globulin: 3.4 g/dL (calc) (ref 1.9–3.7)
Glucose, Bld: 76 mg/dL (ref 65–99)
Potassium: 4.4 mmol/L (ref 3.5–5.3)
SODIUM: 136 mmol/L (ref 135–146)
Total Bilirubin: 0.5 mg/dL (ref 0.2–1.2)
Total Protein: 7.5 g/dL (ref 6.1–8.1)

## 2017-08-28 LAB — CBC WITH DIFFERENTIAL/PLATELET
BASOS ABS: 29 {cells}/uL (ref 0–200)
Basophils Relative: 0.7 %
EOS ABS: 217 {cells}/uL (ref 15–500)
Eosinophils Relative: 5.3 %
HEMATOCRIT: 42.2 % (ref 38.5–50.0)
Hemoglobin: 14.6 g/dL (ref 13.2–17.1)
Lymphs Abs: 2358 cells/uL (ref 850–3900)
MCH: 32.2 pg (ref 27.0–33.0)
MCHC: 34.6 g/dL (ref 32.0–36.0)
MCV: 93.2 fL (ref 80.0–100.0)
MONOS PCT: 9.9 %
MPV: 9.7 fL (ref 7.5–12.5)
NEUTROS PCT: 26.6 %
Neutro Abs: 1091 cells/uL — ABNORMAL LOW (ref 1500–7800)
PLATELETS: 244 10*3/uL (ref 140–400)
RBC: 4.53 10*6/uL (ref 4.20–5.80)
RDW: 12.6 % (ref 11.0–15.0)
Total Lymphocyte: 57.5 %
WBC mixed population: 406 cells/uL (ref 200–950)
WBC: 4.1 10*3/uL (ref 3.8–10.8)

## 2017-08-29 LAB — CYTOLOGY, (ORAL, ANAL, URETHRAL) ANCILLARY ONLY
CHLAMYDIA, DNA PROBE: NEGATIVE
Chlamydia: NEGATIVE
NEISSERIA GONORRHEA: NEGATIVE
Neisseria Gonorrhea: NEGATIVE

## 2017-08-29 LAB — HIV-1 RNA QUANT-NO REFLEX-BLD
HIV 1 RNA QUANT: NOT DETECTED {copies}/mL
HIV-1 RNA QUANT, LOG: NOT DETECTED {Log_copies}/mL

## 2017-08-29 LAB — URINE CYTOLOGY ANCILLARY ONLY
Chlamydia: NEGATIVE
NEISSERIA GONORRHEA: NEGATIVE

## 2017-08-29 LAB — T-HELPER CELL (CD4) - (RCID CLINIC ONLY)
CD4 % Helper T Cell: 42 % (ref 33–55)
CD4 T Cell Abs: 950 /uL (ref 400–2700)

## 2017-08-31 NOTE — Progress Notes (Signed)
Name: Ronald Salas  DOB: [birth year] MRN: [id number] PCP: Patient, No Pcp Per   Patient Active Problem List   Diagnosis Date Noted  . HIV (human immunodeficiency virus infection) (HCC) 07/24/2016  . History of scabies 07/24/2016     Brief Narrative:  Ronald Salas is a 32 y.o. AA male with HIV disease originally diagnosed  10-2012 and in care @ [facility] with Dr. Laural Golden. History of OIs: none. HIV Risk: MSM.   Previous Regimens: . Stribild --> undetectable; changed for TAF safety profile   . Genvoya 07/2016 --> undetectable   Genotypes: . Sensitive   Subjective:  Ronald Salas is here today for routine HIV care and discussion around a rash he has experienced for a while now. He tells me he has done well on his genvoya and does not miss medication doses. He has no trouble with medication side effect and no concerns he wishes to discuss regarding his HIV and reports no complaints today suggestive of associated opportunistic infection or advancing HIV disease such as fevers, night sweats, weight loss, anorexia, cough, SOB, nausea, vomiting, diarrhea, headache, sensory changes, lymphadenopathy or oral thrush. He is currently working as a Scientist, forensic. He has had a rash noted since March of this year in various places. Was diagnosed with scabies about 6 weeks ago after going to ER for check as it became increasingly itchy. He was given permethrin cream and tells me that the itch is gone but the scars of the rashes are bothersome to him. Scattered all over his thighs, groin and to a lesser extend the interdigital webbing/hands.   Review of Systems  All other systems reviewed and are negative.   Past Medical History:  Diagnosis Date  . Ankle injury   . HIV (human immunodeficiency virus infection) (HCC) 07/24/2016   Dx 10/2012  . Inguinal hernia, unilateral     Outpatient Medications Prior to Visit  Medication Sig Dispense Refill  . clotrimazole-betamethasone (LOTRISONE) cream Apply to affected area  2 times daily prn 15 g 0  . elvitegravir-cobicistat-emtricitabine-tenofovir (GENVOYA) 150-150-200-10 MG TABS tablet Take 1 tablet by mouth daily with breakfast. Try to take at the same time each day with small amount of food 30 tablet 5  . fluconazole (DIFLUCAN) 150 MG tablet Take 1 tablet by mouth weekly. 2 tablet 1  . permethrin (ELIMITE) 5 % cream Apply to entire body other than face - let sit for 14 hours then wash off, may repeat in 1 week if still having symptoms 60 g 1   No facility-administered medications prior to visit.      Allergies  Allergen Reactions  . Codeine Other (See Comments)    Stomach pains  . Bee Venom Hives    Hives     Social History   Tobacco Use  . Smoking status: Never Smoker  . Smokeless tobacco: Never Used  Substance Use Topics  . Alcohol use: No  . Drug use: Yes    Types: Marijuana    Family History  Problem Relation Age of Onset  . Hypertension Mother   . Hypertension Father     Social History   Substance and Sexual Activity  Sexual Activity Yes     Objective:   Vitals:   08/27/17 1602  BP: 116/74  Pulse: 60  Temp: 98.6 F (37 C)  TempSrc: Oral  Weight: 161 lb (73 kg)   Body mass index is 21.84 kg/m.  Physical Exam  Constitutional: He is oriented to person, place, and time. He  appears well-developed and well-nourished.  Seated comfortably in chair during visit. Well appearing today and seated comfortably.   HENT:  Mouth/Throat: Oropharynx is clear and moist and mucous membranes are normal. Normal dentition. No dental abscesses.  Cardiovascular: Normal rate and regular rhythm.  Pulmonary/Chest: Effort normal and breath sounds normal. No respiratory distress.  Abdominal: Soft. He exhibits no distension. There is no tenderness.  Lymphadenopathy:    He has no cervical adenopathy.  Neurological: He is alert and oriented to person, place, and time.  Skin: Skin is warm and dry. No rash noted.     Flat hyperpigmented papular  areas on thighs bilaterally with linear excoriations. No erythema.  Penile lesion also examined - no evidence of active lesion only shiny area resembling scar tissue to shaft.   Psychiatric: He has a normal mood and affect. Judgment normal.  In good spirits today and engaged in care discussion.     Lab Results Lab Results  Component Value Date   WBC 4.1 08/27/2017   HGB 14.6 08/27/2017   HCT 42.2 08/27/2017   MCV 93.2 08/27/2017   PLT 244 08/27/2017    Lab Results  Component Value Date   CREATININE 0.86 08/27/2017   BUN 9 08/27/2017   NA 136 08/27/2017   K 4.4 08/27/2017   CL 101 08/27/2017   CO2 30 08/27/2017    Lab Results  Component Value Date   ALT 18 08/27/2017   AST 19 08/27/2017   ALKPHOS 89 07/24/2016   BILITOT 0.5 08/27/2017    Lab Results  Component Value Date   CHOL 154 07/24/2016   HDL 43 07/24/2016   LDLCALC 92 07/24/2016   TRIG 95 07/24/2016   CHOLHDL 3.6 07/24/2016   HIV 1 RNA Quant (copies/mL)  Date Value  08/27/2017 <20 NOT DETECTED  04/11/2017 <20 NOT DETECTED  09/12/2016 <20 NOT DETECTED   CD4 T Cell Abs (/uL)  Date Value  08/27/2017 950  04/11/2017 940  07/24/2016 830     Assessment & Plan:   Problem List Items Addressed This Visit      Other   HIV (human immunodeficiency virus infection) (HCC) - Primary (Chronic)    He is extremely adherent on his Genvoya. Needs HMAP renewal - I asked him to please schedule a follow up this week or early next with Ronald Salas to avoid medication lapse. Advised to please notify clinic should he run out of medications - we can bridge him with samples of medications if needed. He can return in 4-6 months. I asked him to please consider follow up twice a year at least to help [place] remind him to re-enroll in RW program. He agrees to this. Discussed U=U concept in addition to safe sex counseling and prevention of other STIs.  He appreciated the information greatly.       Relevant Orders   HIV 1 RNA quant-no  reflex-bld (Completed)   Urine cytology ancillary only (Completed)   Cytology (oral, anal, urethral) ancillary only (Completed)   CBC with Differential/Platelet (Completed)   COMPLETE METABOLIC PANEL WITH GFR (Completed)   Cytology (oral, anal, urethral) ancillary only (Completed)   T-helper cell (CD4)- ([facility] clinic only) (Completed)   History of scabies    Improved with treatment. We discussed proper care and maintenance to home enviornment to avoid re-infection and it sounds like he has followed through on all recommended measures. Advised to use coconut oil to keep skin hydrated and if he desires to consider over the counter Mederma cream  to scars to help lighten. Advised that it will take time to lighten and some people naturally scar heavier than others.         Rexene AlbertsStephanie Santrice Muzio, MSN, NP-C [facility] Pager: [phone] Office: [phone]  09/02/17  10:20 PM

## 2017-09-02 NOTE — Assessment & Plan Note (Signed)
Improved with treatment. We discussed proper care and maintenance to home enviornment to avoid re-infection and it sounds like he has followed through on all recommended measures. Advised to use coconut oil to keep skin hydrated and if he desires to consider over the counter Mederma cream to scars to help lighten. Advised that it will take time to lighten and some people naturally scar heavier than others.

## 2017-09-02 NOTE — Assessment & Plan Note (Signed)
He is extremely adherent on his Genvoya. Needs HMAP renewal - I asked him to please schedule a follow up this week or early next with [name] to avoid medication lapse. Advised to please notify clinic should he run out of medications - we can bridge him with samples of medications if needed. He can return in 4-6 months. I asked him to please consider follow up twice a year at least to help us remind him to re-enroll in RW program. He agrees to this. Discussed U=U concept in addition to safe sex counseling and prevention of other STIs.  He appreciated the information greatly.

## 2017-12-27 ENCOUNTER — Ambulatory Visit: Payer: Self-pay | Admitting: Infectious Diseases

## 2018-01-01 ENCOUNTER — Ambulatory Visit: Payer: Self-pay

## 2018-01-01 ENCOUNTER — Other Ambulatory Visit: Payer: Self-pay

## 2018-01-01 MED ORDER — AZITHROMYCIN 250 MG PO TABS
1000.0000 mg | ORAL_TABLET | Freq: Once | ORAL | Status: AC
  Administered 2017-07-30: 1000 mg via ORAL

## 2018-01-06 ENCOUNTER — Ambulatory Visit: Payer: Self-pay

## 2018-01-06 ENCOUNTER — Other Ambulatory Visit: Payer: Self-pay

## 2018-01-08 ENCOUNTER — Ambulatory Visit: Payer: Self-pay

## 2018-01-08 ENCOUNTER — Other Ambulatory Visit: Payer: Self-pay

## 2018-01-08 ENCOUNTER — Other Ambulatory Visit: Payer: Self-pay | Admitting: Pharmacist

## 2018-01-08 DIAGNOSIS — B20 Human immunodeficiency virus [HIV] disease: Secondary | ICD-10-CM

## 2018-01-08 DIAGNOSIS — Z21 Asymptomatic human immunodeficiency virus [HIV] infection status: Secondary | ICD-10-CM

## 2018-01-08 MED ORDER — ELVITEG-COBIC-EMTRICIT-TENOFAF 150-150-200-10 MG PO TABS: 1.0000 | ORAL_TABLET | Freq: Every day | ORAL | 5 refills | Status: DC

## 2018-01-09 LAB — T-HELPER CELL (CD4) - (RCID CLINIC ONLY)
CD4 % Helper T Cell: 38 % (ref 33–55)
CD4 T Cell Abs: 650 /uL (ref 400–2700)

## 2018-01-10 LAB — COMPLETE METABOLIC PANEL WITH GFR
AG Ratio: 1.5 (calc) (ref 1.0–2.5)
ALT: 16 U/L (ref 9–46)
AST: 19 U/L (ref 10–40)
Albumin: 4.3 g/dL (ref 3.6–5.1)
Alkaline phosphatase (APISO): 66 U/L (ref 40–115)
BUN: 10 mg/dL (ref 7–25)
CALCIUM: 9.6 mg/dL (ref 8.6–10.3)
CO2: 26 mmol/L (ref 20–32)
Chloride: 103 mmol/L (ref 98–110)
Creat: 1.02 mg/dL (ref 0.60–1.35)
GFR, Est African American: 114 mL/min/{1.73_m2} (ref >=60)
GFR, Est Non African American: 98 mL/min/{1.73_m2} (ref >=60)
Globulin: 2.9 g/dL (calc) (ref 1.9–3.7)
Glucose, Bld: 87 mg/dL (ref 65–99)
Potassium: 4.1 mmol/L (ref 3.5–5.3)
Sodium: 136 mmol/L (ref 135–146)
Total Bilirubin: 0.4 mg/dL (ref 0.2–1.2)
Total Protein: 7.2 g/dL (ref 6.1–8.1)

## 2018-01-10 LAB — CBC WITH DIFFERENTIAL/PLATELET
BASOS ABS: 30 {cells}/uL (ref 0–200)
Basophils Relative: 0.8 %
Eosinophils Absolute: 118 cells/uL (ref 15–500)
Eosinophils Relative: 3.1 %
HCT: 40.1 % (ref 38.5–50.0)
Hemoglobin: 14.2 g/dL (ref 13.2–17.1)
Lymphs Abs: 1668 cells/uL (ref 850–3900)
MCH: 32.6 pg (ref 27.0–33.0)
MCHC: 35.4 g/dL (ref 32.0–36.0)
MCV: 92 fL (ref 80.0–100.0)
MPV: 9.6 fL (ref 7.5–12.5)
Monocytes Relative: 9.1 %
Neutro Abs: 1638 cells/uL (ref 1500–7800)
Neutrophils Relative %: 43.1 %
Platelets: 229 10*3/uL (ref 140–400)
RBC: 4.36 10*6/uL (ref 4.20–5.80)
RDW: 13 % (ref 11.0–15.0)
Total Lymphocyte: 43.9 %
WBC mixed population: 346 cells/uL (ref 200–950)
WBC: 3.8 10*3/uL (ref 3.8–10.8)

## 2018-01-10 LAB — HIV-1 RNA QUANT-NO REFLEX-BLD
HIV 1 RNA Quant: <20 copies/mL
HIV-1 RNA Quant, Log: <1.3 Log copies/mL

## 2018-01-13 ENCOUNTER — Encounter: Payer: Self-pay | Admitting: Infectious Diseases

## 2018-01-20 ENCOUNTER — Ambulatory Visit: Payer: Self-pay | Admitting: Infectious Diseases

## 2018-02-13 ENCOUNTER — Encounter: Payer: Self-pay | Admitting: Infectious Diseases

## 2018-02-13 ENCOUNTER — Ambulatory Visit (INDEPENDENT_AMBULATORY_CARE_PROVIDER_SITE_OTHER): Payer: Self-pay | Admitting: Infectious Diseases

## 2018-02-13 VITALS — BP 113/79 | HR 62 | Temp 97.9°F | Wt 160.0 lb

## 2018-02-13 DIAGNOSIS — Z21 Asymptomatic human immunodeficiency virus [HIV] infection status: Secondary | ICD-10-CM

## 2018-02-13 DIAGNOSIS — Z8619 Personal history of other infectious and parasitic diseases: Secondary | ICD-10-CM

## 2018-02-13 DIAGNOSIS — R21 Rash and other nonspecific skin eruption: Secondary | ICD-10-CM

## 2018-02-13 DIAGNOSIS — Z23 Encounter for immunization: Secondary | ICD-10-CM

## 2018-02-13 NOTE — Patient Instructions (Signed)
Please upload a picture to your MyChart of your rash "at it's worse"   Will send in a referral for you to see dermatology.   Please return in 6 months with labs prior to if you can for routine follow up and re-enrollment for ADAP.

## 2018-02-13 NOTE — Progress Notes (Signed)
Name: Ronald Salas  DOB: [birth year] MRN: [id number] PCP: Patient, No Pcp Per   Patient Active Problem List   Diagnosis Date Noted  . Rash of penis 02/14/2018  . History of syphilis 02/14/2018  . HIV (human immunodeficiency virus infection) (HCC) 07/24/2016  . History of scabies 07/24/2016     Brief Narrative:  Ronald Salas is a 33 y.o. AA male with HIV disease originally diagnosed  10-2012 and in care @ [facility] with Dr. Laural Golden. History of OIs: none. HIV Risk: MSM.   Previous Regimens: . Stribild --> undetectable; changed for TAF safety profile   . Genvoya 07/2016 --> undetectable   Genotypes: . Sensitive   Subjective:  CC:  Ronald Salas is here today for routine HIV care and discussion around a rash that is back again.   HPI:  Last seen in July2019. He tells me he has done well on his genvoya and does not miss medication doses. He has no trouble with medication side effect and no concerns he wishes to discuss regarding his HIV and reports no complaints today suggestive of associated opportunistic infection or advancing HIV disease such as fevers, night sweats, weight loss, anorexia, cough, SOB, nausea, vomiting, diarrhea, headache, sensory changes, lymphadenopathy or oral thrush. He is currently working as a Scientist, forensic. Planning on meeting with financial team today to renew UMAP.   He is concerned about the recurrence of rash on glans penis. Describes the skin to be very dry/scaly and he frequently pulls these areas off. No blisters, drainage. Not really itchy. More distressing for him and concerning that nothing is working. Has tried over the counter hydrocortisone, coconut butter, oils, prescription strength creams we have given him. No other spots that he has this problem.   Review of Systems  All other systems reviewed and are negative.   Past Medical History:  Diagnosis Date  . Ankle injury   . HIV (human immunodeficiency virus infection) (HCC) 07/24/2016   Dx 10/2012  .  Inguinal hernia, unilateral     Outpatient Medications Prior to Visit  Medication Sig Dispense Refill  . elvitegravir-cobicistat-emtricitabine-tenofovir (GENVOYA) 150-150-200-10 MG TABS tablet Take 1 tablet by mouth daily with breakfast. Try to take at the same time each day with small amount of food 30 tablet 5  . clotrimazole-betamethasone (LOTRISONE) cream Apply to affected area 2 times daily prn (Patient not taking: Reported on 02/13/2018) 15 g 0  . fluconazole (DIFLUCAN) 150 MG tablet Take 1 tablet by mouth weekly. (Patient not taking: Reported on 02/13/2018) 2 tablet 1  . permethrin (ELIMITE) 5 % cream Apply to entire body other than face - let sit for 14 hours then wash off, may repeat in 1 week if still having symptoms (Patient not taking: Reported on 02/13/2018) 60 g 1   No facility-administered medications prior to visit.      Allergies  Allergen Reactions  . Codeine Other (See Comments)    Stomach pains  . Bee Venom Hives    Hives     Social History   Tobacco Use  . Smoking status: Never Smoker  . Smokeless tobacco: Never Used  Substance Use Topics  . Alcohol use: No  . Drug use: Yes    Types: Marijuana    Social History   Substance and Sexual Activity  Sexual Activity Yes    Objective:   Vitals:   02/13/18 0912  BP: 113/79  Pulse: 62  Temp: 97.9 F (36.6 C)  TempSrc: Oral  Weight: 160 lb (72.6 kg)  Body mass index is 21.7 kg/m.  Physical Exam Vitals signs reviewed.  Constitutional:      Appearance: Normal appearance. He is not ill-appearing.  Cardiovascular:     Rate and Rhythm: Normal rate.  Pulmonary:     Effort: Pulmonary effort is normal. No respiratory distress.  Genitourinary:    Comments: No active lesions - he will upload a picture to MyChart from flare.  Musculoskeletal: Normal range of motion.  Skin:    General: Skin is warm and dry.     Comments: Other lesions on legs and buttocks have resolved following treatment for scabies  infection.   Neurological:     Mental Status: He is alert.       Lab Results Lab Results  Component Value Date   WBC 3.8 01/08/2018   HGB 14.2 01/08/2018   HCT 40.1 01/08/2018   MCV 92.0 01/08/2018   PLT 229 01/08/2018    Lab Results  Component Value Date   CREATININE 1.02 01/08/2018   BUN 10 01/08/2018   NA 136 01/08/2018   K 4.1 01/08/2018   CL 103 01/08/2018   CO2 26 01/08/2018    Lab Results  Component Value Date   ALT 16 01/08/2018   AST 19 01/08/2018   ALKPHOS 89 07/24/2016   BILITOT 0.4 01/08/2018    Lab Results  Component Value Date   CHOL 154 07/24/2016   HDL 43 07/24/2016   LDLCALC 92 07/24/2016   TRIG 95 07/24/2016   CHOLHDL 3.6 07/24/2016   HIV 1 RNA Quant (copies/mL)  Date Value  01/08/2018 <20 NOT DETECTED  08/27/2017 <20 NOT DETECTED  04/11/2017 <20 NOT DETECTED   CD4 T Cell Abs (/uL)  Date Value  01/08/2018 650  08/27/2017 950  04/11/2017 940     Assessment & Plan:   Problem List Items Addressed This Visit      Unprioritized   HIV (human immunodeficiency virus infection) (HCC) (Chronic)    Continue Genvoya - check labs today for monitoring. He has maintained long term control of his infection and continues to do very well with adherence. He can follow up again in 6 months.  Flu shot today.       Relevant Orders   HIV-1 RNA quant-no reflex-bld   T-helper cell (CD4)- ([facility] clinic only)   RPR   Urine cytology ancillary only   Rash of penis    Picture reviewed - scaly plaques along the glans of the penis. Very dry skin. 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 Advised to limit pulling off of skin to decrease risk for secondary infection and damage/trauma to epidermis. ?hypersensitivity reaction to latex condoms vs lubricant but he denies use of either. Advised OTC hydrocortisone and offered stronger topical steroid (desonide vs triamcinolone) for short term use as needed for worsened flares,14d with  transition back to lower-potency option. Counseled that this is not contagious. I informed him that in no way is this related to his HIV medication.   He would like referral to dermatology - will arrange.       History of syphilis    Titers have been stable @ 1:2 indicating serofast state but no concern for re-infection. His rash is not consistent with syphilitic manifestation either. Repeat with next lab draw for routine screening.        Other Visit Diagnoses    Rash    -  Primary   Relevant Orders   Ambulatory referral to Dermatology   Need for  immunization against influenza       Relevant Orders   Flu Vaccine QUAD 36+ mos IM (Completed)      Rexene AlbertsStephanie , MSN, NP-C [facility] Pager: [phone] Office: [phone]  02/14/18  9:42 PM

## 2018-02-14 DIAGNOSIS — R21 Rash and other nonspecific skin eruption: Secondary | ICD-10-CM | POA: Insufficient documentation

## 2018-02-14 DIAGNOSIS — Z8619 Personal history of other infectious and parasitic diseases: Secondary | ICD-10-CM | POA: Insufficient documentation

## 2018-02-14 NOTE — Assessment & Plan Note (Signed)
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 His rash is not consistent with syphilitic manifestation either. Repeat with next lab draw for routine screening.

## 2018-02-14 NOTE — Assessment & Plan Note (Signed)
Continue Genvoya - check labs today for monitoring. He has maintained long term control of his infection and continues to do very well with adherence. He can follow up again in 6 months.  Flu shot today.

## 2018-02-14 NOTE — Assessment & Plan Note (Addendum)
Picture reviewed - scaly plaques along the glans of the penis. Very dry skin. 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 Advised to limit pulling off of skin to decrease risk for secondary infection and damage/trauma to epidermis. ?hypersensitivity reaction to latex condoms vs lubricant but he denies use of either. Advised OTC hydrocortisone and offered stronger topical steroid (desonide vs triamcinolone) for short term use as needed for worsened flares,14d with transition back to lower-potency option. Counseled that this is not contagious. I informed him that in no way is this related to his HIV medication.   He would like referral to dermatology - will arrange.

## 2018-02-17 ENCOUNTER — Encounter: Payer: Self-pay | Admitting: Infectious Diseases

## 2018-05-13 ENCOUNTER — Telehealth: Payer: Self-pay

## 2018-05-13 NOTE — Telephone Encounter (Signed)
Patient called office today requesting an appointment to be seen regarding rash on feet. Informed patient that we do not have any open appointments at this time,and that he should see PCP or Urgent care. Patient verbalized understanding. Ronald Salas, [place]

## 2018-08-14 ENCOUNTER — Other Ambulatory Visit: Payer: Self-pay

## 2018-08-28 ENCOUNTER — Encounter: Payer: Self-pay | Admitting: Infectious Diseases

## 2018-11-13 ENCOUNTER — Other Ambulatory Visit: Payer: Self-pay

## 2018-11-13 ENCOUNTER — Ambulatory Visit: Payer: Self-pay

## 2018-11-14 ENCOUNTER — Other Ambulatory Visit: Payer: Self-pay | Admitting: Family

## 2018-11-14 DIAGNOSIS — Z21 Asymptomatic human immunodeficiency virus [HIV] infection status: Secondary | ICD-10-CM

## 2018-11-19 ENCOUNTER — Other Ambulatory Visit: Payer: Self-pay

## 2018-11-19 ENCOUNTER — Ambulatory Visit: Payer: Self-pay

## 2018-11-28 ENCOUNTER — Encounter: Payer: Self-pay | Admitting: Infectious Diseases

## 2018-12-01 ENCOUNTER — Ambulatory Visit: Payer: Self-pay

## 2018-12-01 ENCOUNTER — Other Ambulatory Visit: Payer: Self-pay

## 2018-12-01 DIAGNOSIS — Z21 Asymptomatic human immunodeficiency virus [HIV] infection status: Secondary | ICD-10-CM

## 2018-12-02 LAB — T-HELPER CELL (CD4) - (RCID CLINIC ONLY)
CD4 % Helper T Cell: 46 % (ref 33–65)
CD4 T Cell Abs: 932 /uL (ref 400–1790)

## 2018-12-04 LAB — HIV-1 RNA QUANT-NO REFLEX-BLD
HIV 1 RNA Quant: <20 copies/mL
HIV-1 RNA Quant, Log: <1.3 Log copies/mL

## 2018-12-04 LAB — RPR TITER: RPR Titer: 1:2 {titer} — ABNORMAL HIGH

## 2018-12-04 LAB — FLUORESCENT TREPONEMAL AB(FTA)-IGG-BLD: Fluorescent Treponemal ABS: REACTIVE — AB

## 2018-12-04 LAB — RPR: RPR Ser Ql: REACTIVE — AB

## 2018-12-15 ENCOUNTER — Encounter: Payer: Self-pay | Admitting: Infectious Diseases

## 2018-12-17 ENCOUNTER — Telehealth: Payer: Self-pay | Admitting: *Deleted

## 2018-12-17 NOTE — Telephone Encounter (Signed)
Likely gonorrhea vs chlamydia. He actually did not show for his last office visit (maybe the last two). Only did labs.  I am not available to see him today so if he would like to get this taken care of today would suggest Urgent Care vs [facility] as you have. Otherwise I can see him Friday AM. Thanks Darnelle Maffucci.

## 2018-12-17 NOTE — Telephone Encounter (Signed)
Per Colletta [place] called the patient back and advised we can see him on Friday 12/19/18 or he could be seen at the Urgent care or Health Department. He wan not happy but he accepted a visit for Friday with [facility].

## 2018-12-17 NOTE — Telephone Encounter (Signed)
Patient called to report that after unprotected oral sex he is having urinary urgency and slight discharge. He wants to come and be seen in the office for this. He also states that he was told by his provider to call the office if this happens again. Advised him he has just had an office visit and I will have to let his provider know as he should probably go to the Health Department or Urgent care. Advised him will give him a call back once she responds.  After review of his chart he did not have a urine at his last lab appt.

## 2018-12-19 ENCOUNTER — Ambulatory Visit: Payer: Self-pay | Admitting: Infectious Diseases

## 2019-02-18 ENCOUNTER — Encounter: Payer: Self-pay | Admitting: Infectious Diseases

## 2019-02-25 ENCOUNTER — Telehealth: Payer: Self-pay | Admitting: *Deleted

## 2019-02-25 NOTE — Telephone Encounter (Signed)
Patient had telephone appointment with Ronald Salas today for ADAP questions.  He asked to speak with a nurse for orthopedic referral for ongoing foot problems.  Patient last seen 02/13/2018. RN explained that Judeth Cornfield would need to see him in order to send up to date notes/assessment with the referral.  RN offered patient patient lab/follow up appointment, but patient requested lab at same visit. He is scheduled for visit 2/8, as he is currently out of town. He did not want to wait that long for his foot issue, will call orthopedic offices or visit urgent care with his insurance.  Andree Coss, RN

## 2019-03-17 ENCOUNTER — Encounter: Payer: Self-pay | Admitting: Infectious Diseases

## 2019-03-18 ENCOUNTER — Encounter: Payer: Self-pay | Admitting: Infectious Diseases

## 2021-03-07 ENCOUNTER — Telehealth: Payer: Self-pay

## 2021-03-07 NOTE — Telephone Encounter (Signed)
Patient last seen 02/2018 - called to offer appointment. No answer and mailbox full.  Sandie Ano, RN

## 2021-05-22 ENCOUNTER — Telehealth: Payer: Self-pay

## 2021-05-22 NOTE — Telephone Encounter (Signed)
Patient last seen 02/2018. Spoke with him today, he had moved to [place] for a few years, but is back in [place] as of January. He accepts appointment with Rexene Alberts, NP 06/06/21. He prefers to completed labs and financial same day.  ? ?Sandie Ano, RN ? ?

## 2021-06-02 ENCOUNTER — Other Ambulatory Visit (HOSPITAL_COMMUNITY): Payer: Self-pay

## 2021-06-02 ENCOUNTER — Telehealth: Payer: Self-pay

## 2021-06-02 NOTE — Telephone Encounter (Signed)
[facility] Patient Advocate Encounter ? ?Insurance verification completed.   ? ?The patient is uninsured and will need patient assistance for medication. ? ?We can complete the application and will need to meet with the patient for signatures and income documentation. ? ?Emina Ribaudo, CPhT ?Specialty Pharmacy Patient Advocate ?[facility] ?Phone: [phone] ?Fax:  [phone]  ?

## 2021-06-06 ENCOUNTER — Ambulatory Visit: Payer: Self-pay | Admitting: Infectious Diseases

## 2021-06-12 ENCOUNTER — Telehealth: Payer: Self-pay | Admitting: Infectious Diseases

## 2021-06-12 NOTE — Telephone Encounter (Signed)
Pt sent MyChart msg to request a reschedule on a new show appt. Called pt to resched, no answer, and VM box full.  ?
